# Patient Record
Sex: Female | Born: 1935 | Race: White | Hispanic: No | Smoking: Never smoker
Health system: Southern US, Community
[De-identification: ages and names within clinical notes are randomized; demographics above are authoritative.]

## PROBLEM LIST (undated history)

## (undated) DIAGNOSIS — E079 Disorder of thyroid, unspecified: Secondary | ICD-10-CM

## (undated) DIAGNOSIS — C801 Malignant (primary) neoplasm, unspecified: Secondary | ICD-10-CM

## (undated) DIAGNOSIS — N289 Disorder of kidney and ureter, unspecified: Secondary | ICD-10-CM

## (undated) DIAGNOSIS — I10 Essential (primary) hypertension: Secondary | ICD-10-CM

## (undated) HISTORY — PX: MASTECTOMY: SHX3

---

## 1998-01-17 ENCOUNTER — Other Ambulatory Visit: Admission: RE | Admit: 1998-01-17 | Discharge: 1998-01-17 | Payer: Self-pay | Admitting: Oncology

## 1998-07-08 ENCOUNTER — Ambulatory Visit (HOSPITAL_BASED_OUTPATIENT_CLINIC_OR_DEPARTMENT_OTHER): Admission: RE | Admit: 1998-07-08 | Discharge: 1998-07-08 | Payer: Self-pay | Admitting: Otolaryngology

## 1998-10-03 ENCOUNTER — Other Ambulatory Visit: Admission: RE | Admit: 1998-10-03 | Discharge: 1998-10-03 | Payer: Self-pay | Admitting: Oncology

## 1999-06-13 ENCOUNTER — Encounter: Admission: RE | Admit: 1999-06-13 | Discharge: 1999-06-13 | Payer: Self-pay | Admitting: Oncology

## 1999-06-15 ENCOUNTER — Inpatient Hospital Stay (HOSPITAL_COMMUNITY): Admission: EM | Admit: 1999-06-15 | Discharge: 1999-06-16 | Payer: Self-pay | Admitting: Emergency Medicine

## 1999-06-15 ENCOUNTER — Encounter: Payer: Self-pay | Admitting: Emergency Medicine

## 2000-06-13 ENCOUNTER — Other Ambulatory Visit: Admission: RE | Admit: 2000-06-13 | Discharge: 2000-06-13 | Payer: Self-pay | Admitting: Internal Medicine

## 2000-07-01 ENCOUNTER — Encounter: Admission: RE | Admit: 2000-07-01 | Discharge: 2000-07-01 | Payer: Self-pay | Admitting: Oncology

## 2000-07-01 ENCOUNTER — Encounter: Payer: Self-pay | Admitting: Oncology

## 2001-06-18 ENCOUNTER — Encounter: Payer: Self-pay | Admitting: Oncology

## 2001-06-18 ENCOUNTER — Encounter: Admission: RE | Admit: 2001-06-18 | Discharge: 2001-06-18 | Payer: Self-pay | Admitting: Oncology

## 2001-11-07 ENCOUNTER — Ambulatory Visit (HOSPITAL_COMMUNITY): Admission: RE | Admit: 2001-11-07 | Discharge: 2001-11-07 | Payer: Self-pay | Admitting: Internal Medicine

## 2001-11-07 ENCOUNTER — Encounter: Payer: Self-pay | Admitting: Internal Medicine

## 2002-05-26 ENCOUNTER — Encounter: Admission: RE | Admit: 2002-05-26 | Discharge: 2002-05-26 | Payer: Self-pay | Admitting: Internal Medicine

## 2002-05-26 ENCOUNTER — Encounter: Payer: Self-pay | Admitting: Internal Medicine

## 2003-01-06 ENCOUNTER — Other Ambulatory Visit: Admission: RE | Admit: 2003-01-06 | Discharge: 2003-01-06 | Payer: Self-pay | Admitting: Internal Medicine

## 2003-01-27 ENCOUNTER — Encounter: Payer: Self-pay | Admitting: Internal Medicine

## 2003-01-27 ENCOUNTER — Encounter: Admission: RE | Admit: 2003-01-27 | Discharge: 2003-01-27 | Payer: Self-pay | Admitting: Internal Medicine

## 2003-04-16 ENCOUNTER — Encounter: Admission: RE | Admit: 2003-04-16 | Discharge: 2003-04-16 | Payer: Self-pay | Admitting: Oncology

## 2003-04-16 ENCOUNTER — Encounter: Payer: Self-pay | Admitting: Oncology

## 2003-10-05 ENCOUNTER — Encounter: Admission: RE | Admit: 2003-10-05 | Discharge: 2003-10-05 | Payer: Self-pay | Admitting: Internal Medicine

## 2003-10-13 ENCOUNTER — Encounter: Admission: RE | Admit: 2003-10-13 | Discharge: 2003-10-13 | Payer: Self-pay | Admitting: Orthopaedic Surgery

## 2004-02-04 ENCOUNTER — Encounter: Admission: RE | Admit: 2004-02-04 | Discharge: 2004-02-04 | Payer: Self-pay | Admitting: Nephrology

## 2004-04-21 ENCOUNTER — Encounter: Admission: RE | Admit: 2004-04-21 | Discharge: 2004-04-21 | Payer: Self-pay | Admitting: Oncology

## 2005-05-24 ENCOUNTER — Encounter: Admission: RE | Admit: 2005-05-24 | Discharge: 2005-05-24 | Payer: Self-pay | Admitting: Oncology

## 2006-05-27 ENCOUNTER — Encounter: Admission: RE | Admit: 2006-05-27 | Discharge: 2006-05-27 | Payer: Self-pay | Admitting: Internal Medicine

## 2007-07-17 ENCOUNTER — Encounter: Admission: RE | Admit: 2007-07-17 | Discharge: 2007-07-17 | Payer: Self-pay | Admitting: Internal Medicine

## 2007-11-11 ENCOUNTER — Ambulatory Visit: Payer: Self-pay | Admitting: Sports Medicine

## 2007-11-11 DIAGNOSIS — M719 Bursopathy, unspecified: Secondary | ICD-10-CM

## 2007-11-11 DIAGNOSIS — M67919 Unspecified disorder of synovium and tendon, unspecified shoulder: Secondary | ICD-10-CM | POA: Insufficient documentation

## 2010-09-24 ENCOUNTER — Encounter: Payer: Self-pay | Admitting: *Deleted

## 2012-10-11 ENCOUNTER — Encounter (HOSPITAL_COMMUNITY): Payer: Self-pay | Admitting: Emergency Medicine

## 2012-10-11 ENCOUNTER — Emergency Department (HOSPITAL_COMMUNITY): Payer: Medicare Other

## 2012-10-11 ENCOUNTER — Emergency Department (HOSPITAL_COMMUNITY)
Admission: EM | Admit: 2012-10-11 | Discharge: 2012-10-11 | Disposition: A | Discharge status: Home / Self Care | Payer: Medicare Other | Attending: Emergency Medicine | Admitting: Emergency Medicine

## 2012-10-11 DIAGNOSIS — Z853 Personal history of malignant neoplasm of breast: Secondary | ICD-10-CM | POA: Insufficient documentation

## 2012-10-11 DIAGNOSIS — S51822A Laceration with foreign body of left forearm, initial encounter: Secondary | ICD-10-CM

## 2012-10-11 DIAGNOSIS — E079 Disorder of thyroid, unspecified: Secondary | ICD-10-CM | POA: Insufficient documentation

## 2012-10-11 DIAGNOSIS — W010XXA Fall on same level from slipping, tripping and stumbling without subsequent striking against object, initial encounter: Secondary | ICD-10-CM | POA: Insufficient documentation

## 2012-10-11 DIAGNOSIS — I10 Essential (primary) hypertension: Secondary | ICD-10-CM | POA: Insufficient documentation

## 2012-10-11 DIAGNOSIS — Y9301 Activity, walking, marching and hiking: Secondary | ICD-10-CM | POA: Insufficient documentation

## 2012-10-11 DIAGNOSIS — W1809XA Striking against other object with subsequent fall, initial encounter: Secondary | ICD-10-CM | POA: Insufficient documentation

## 2012-10-11 DIAGNOSIS — Z23 Encounter for immunization: Secondary | ICD-10-CM | POA: Insufficient documentation

## 2012-10-11 DIAGNOSIS — S51809A Unspecified open wound of unspecified forearm, initial encounter: Secondary | ICD-10-CM | POA: Insufficient documentation

## 2012-10-11 DIAGNOSIS — Y9210 Unspecified residential institution as the place of occurrence of the external cause: Secondary | ICD-10-CM | POA: Insufficient documentation

## 2012-10-11 DIAGNOSIS — Z7982 Long term (current) use of aspirin: Secondary | ICD-10-CM | POA: Insufficient documentation

## 2012-10-11 DIAGNOSIS — Z79899 Other long term (current) drug therapy: Secondary | ICD-10-CM | POA: Insufficient documentation

## 2012-10-11 HISTORY — DX: Malignant (primary) neoplasm, unspecified: C80.1

## 2012-10-11 HISTORY — DX: Essential (primary) hypertension: I10

## 2012-10-11 HISTORY — DX: Disorder of thyroid, unspecified: E07.9

## 2012-10-11 MED ORDER — CEPHALEXIN 500 MG PO CAPS
500.0000 mg | ORAL_CAPSULE | Freq: Once | ORAL | Status: AC
  Administered 2012-10-11: 500 mg via ORAL
  Filled 2012-10-11: qty 1

## 2012-10-11 MED ORDER — CEPHALEXIN 500 MG PO CAPS: 500.0000 mg | ORAL_CAPSULE | Freq: Four times a day (QID) | ORAL | Status: DC

## 2012-10-11 MED ORDER — TETANUS-DIPHTH-ACELL PERTUSSIS 5-2.5-18.5 LF-MCG/0.5 IM SUSP
0.5000 mL | Freq: Once | INTRAMUSCULAR | Status: AC
  Administered 2012-10-11: 0.5 mL via INTRAMUSCULAR
  Filled 2012-10-11: qty 0.5

## 2012-10-11 NOTE — ED Provider Notes (Signed)
History     CSN: [id number]  Arrival date & time 10/11/12  1956   First MD Initiated Contact with Patient 10/11/12 2124      Chief Complaint  Patient presents with  . Fall    (Consider location/radiation/quality/duration/timing/severity/associated sxs/prior treatment) HPI  Past Medical History  Diagnosis Date  . Cancer     breast  . Hypertension   . Thyroid disease     Past Surgical History  Procedure Laterality Date  . Mastectomy      right    No family history on file.  History  Substance Use Topics  . Smoking status: Never Smoker   . Smokeless tobacco: Not on file  . Alcohol Use: No    OB History   Grav Para Term Preterm Abortions TAB SAB Ect Mult Living                  Review of Systems  Allergies  Review of patient's allergies indicates no known allergies.  Home Medications   Current Outpatient Rx  Name  Route  Sig  Dispense  Refill  . AmLODIPine Besylate (NORVASC PO)   Oral   Take 1 tablet by mouth every morning.         Marland Kitchen aspirin EC 81 MG tablet   Oral   Take 81 mg by mouth every evening.         Marland Kitchen b complex vitamins capsule   Oral   Take 1 capsule by mouth every evening.         Marland Kitchen CALCITRIOL PO   Oral   Take 1 capsule by mouth every morning.         Marland Kitchen co-enzyme Q-10 30 MG capsule   Oral   Take 30 mg by mouth daily with lunch.         . fish oil-omega-3 fatty acids 1000 MG capsule   Oral   Take 1 g by mouth 2 (two) times daily.         . Multiple Minerals-Vitamins (CALCIUM CITRATE PLUS/MAGNESIUM) TABS   Oral   Take 1 tablet by mouth daily with lunch.         . Multiple Vitamins-Minerals (ICAPS PO)   Oral   Take 1 capsule by mouth 2 (two) times daily.         . Telmisartan (MICARDIS PO)   Oral   Take 1 tablet by mouth every evening.         . vitamin B-12 (CYANOCOBALAMIN) 100 MCG tablet   Oral   Take 100 mcg by mouth every evening.           BP 162/66  Pulse 81  Temp(Src) 98.2 F (36.8 C)  (Oral)  Resp 16  SpO2 99%  Physical Exam  ED Course  LACERATION REPAIR Date/Time: 10/11/2012 10:13 PM Performed by: Arman Filter Authorized by: Arman Filter Consent: Verbal consent obtained. Risks and benefits: risks, benefits and alternatives were discussed Consent given by: patient Patient understanding: patient states understanding of the procedure being performed Patient identity confirmed: verbally with patient Time out: Immediately prior to procedure a "time out" was called to verify the correct patient, procedure, equipment, support staff and site/side marked as required. Body area: upper extremity Location details: left lower arm Laceration length: 7 cm Foreign bodies: no foreign bodies Tendon involvement: none Nerve involvement: none Vascular damage: no Anesthesia: local infiltration Local anesthetic: lidocaine 1% with epinephrine Anesthetic total: 4 ml Patient sedated: no Preparation: Patient was  prepped and draped in the usual sterile fashion. Irrigation solution: saline Irrigation method: syringe Amount of cleaning: extensive Debridement: minimal Degree of undermining: none Skin closure: 3-0 Prolene Number of sutures: 12 Technique: simple Approximation: close Approximation difficulty: simple Dressing: antibiotic ointment, non-adhesive packing strip and gauze packing Patient tolerance: Patient tolerated the procedure well with no immediate complications.   (including critical care time)  Labs Reviewed - No data to display Dg Forearm Left  10/11/2012  *RADIOLOGY REPORT*  Clinical Data: Laceration and bruising to the mid shaft posterior lateral forearm after fall.  LEFT FOREARM - 2 VIEW  Comparison: None.  Findings: Soft tissue swelling over the dorsal aspect of the forearm with infiltration in the subcutaneous fat suggesting contusion or hematoma.  No radiopaque soft tissue foreign body or gas collection demonstrated.  The bones appear intact.  No evidence of  acute fracture or subluxation.  Tiny fragment adjacent to the ulnar styloid process is nonspecific and may represent old ununited ossicle or avulsion fracture.  Correlation with location of pain is recommended.  IMPRESSION: Soft tissue contusion/hematoma over the dorsal aspect of the forearm.  No displaced fractures identified.  Nonspecific ossicle over the ulnar styloid process.   Original Report Authenticated By: Burman Nieves, M.D.      No diagnosis found.    MDM           Arman Filter, NP 10/11/12 2216

## 2012-10-11 NOTE — ED Provider Notes (Signed)
Medical screening examination/treatment/procedure(s) were performed by non-physician practitioner and as supervising physician I was immediately available for consultation/collaboration.  See my separate note for H&P.   Richardean Canal, MD 10/11/12 2217

## 2012-10-11 NOTE — ED Notes (Signed)
MD at bedside. 

## 2012-10-11 NOTE — ED Provider Notes (Signed)
History     CSN: [id number]  Arrival date & time 10/11/12  1956   First MD Initiated Contact with Patient 10/11/12 2124      Chief Complaint  Patient presents with  . Fall    (Consider location/radiation/quality/duration/timing/severity/associated sxs/prior treatment) The history is provided by the patient and a relative.  Megan Gallegos is a 79 y.o. female hx of HTN, breast Ca here with L forearm laceration. She lives at independent living and was walking and had a mechanical fall and hit left forearm on a cabinet. No head injury or LOC. Sent for laceration repair. Tetanus unknown.    Past Medical History  Diagnosis Date  . Cancer     breast  . Hypertension   . Thyroid disease     Past Surgical History  Procedure Laterality Date  . Mastectomy      right    No family history on file.  History  Substance Use Topics  . Smoking status: Never Smoker   . Smokeless tobacco: Not on file  . Alcohol Use: No    OB History   Grav Para Term Preterm Abortions TAB SAB Ect Mult Living                  Review of Systems  Musculoskeletal:       L arm pain  Skin: Positive for wound.  All other systems reviewed and are negative.    Allergies  Review of patient's allergies indicates no known allergies.  Home Medications   Current Outpatient Rx  Name  Route  Sig  Dispense  Refill  . AmLODIPine Besylate (NORVASC PO)   Oral   Take 1 tablet by mouth every morning.         Marland Kitchen aspirin EC 81 MG tablet   Oral   Take 81 mg by mouth every evening.         Marland Kitchen b complex vitamins capsule   Oral   Take 1 capsule by mouth every evening.         Marland Kitchen CALCITRIOL PO   Oral   Take 1 capsule by mouth every morning.         Marland Kitchen co-enzyme Q-10 30 MG capsule   Oral   Take 30 mg by mouth daily with lunch.         . fish oil-omega-3 fatty acids 1000 MG capsule   Oral   Take 1 g by mouth 2 (two) times daily.         . Multiple Minerals-Vitamins (CALCIUM CITRATE  PLUS/MAGNESIUM) TABS   Oral   Take 1 tablet by mouth daily with lunch.         . Multiple Vitamins-Minerals (ICAPS PO)   Oral   Take 1 capsule by mouth 2 (two) times daily.         . Telmisartan (MICARDIS PO)   Oral   Take 1 tablet by mouth every evening.         . vitamin B-12 (CYANOCOBALAMIN) 100 MCG tablet   Oral   Take 100 mcg by mouth every evening.           BP 162/66  Pulse 81  Temp(Src) 98.2 F (36.8 C) (Oral)  Resp 16  SpO2 99%  Physical Exam  Nursing note and vitals reviewed. Constitutional: She is oriented to person, place, and time. She appears well-developed and well-nourished.  HENT:  Head: Normocephalic.  Mouth/Throat: Oropharynx is clear and moist.  Eyes: Conjunctivae are normal.  Pupils are equal, round, and reactive to light.  Neck: Normal range of motion. Neck supple.  Cardiovascular: Normal rate.   Pulmonary/Chest: Effort normal.  Abdominal: Soft.  Musculoskeletal: Normal range of motion.  L forearm with obvious bruise and V shaped laceration. + erythema surrounding the wound as well. No subcutaneous air.   Neurological: She is alert and oriented to person, place, and time.  Skin: Skin is warm.     Psychiatric: She has a normal mood and affect. Her behavior is normal. Judgment and thought content normal.    ED Course  Procedures (including critical care time)  Labs Reviewed - No data to display Dg Forearm Left  10/11/2012  *RADIOLOGY REPORT*  Clinical Data: Laceration and bruising to the mid shaft posterior lateral forearm after fall.  LEFT FOREARM - 2 VIEW  Comparison: None.  Findings: Soft tissue swelling over the dorsal aspect of the forearm with infiltration in the subcutaneous fat suggesting contusion or hematoma.  No radiopaque soft tissue foreign body or gas collection demonstrated.  The bones appear intact.  No evidence of acute fracture or subluxation.  Tiny fragment adjacent to the ulnar styloid process is nonspecific and may  represent old ununited ossicle or avulsion fracture.  Correlation with location of pain is recommended.  IMPRESSION: Soft tissue contusion/hematoma over the dorsal aspect of the forearm.  No displaced fractures identified.  Nonspecific ossicle over the ulnar styloid process.   Original Report Authenticated By: Burman Nieves, M.D.      No diagnosis found.    MDM  Megan Gallegos is a 79 y.o. female here with L forearm laceration. Tetanus updated. Laceration repaired by PA. Also may have small hematoma vs cellulitis. Will give keflex for empiric treatment. Return precautions given to patient and family.          Richardean Canal, MD 10/11/12 2145

## 2012-10-11 NOTE — ED Notes (Signed)
Pt arrived POV with daughter from [facility], pt states she tripped over chairs @ 1530, large skin tear noted to L forearm. Bleeding controlled. Swelling noted

## 2013-11-11 ENCOUNTER — Encounter (HOSPITAL_COMMUNITY): Payer: Self-pay | Admitting: Emergency Medicine

## 2013-11-11 ENCOUNTER — Emergency Department (HOSPITAL_COMMUNITY)
Admission: EM | Admit: 2013-11-11 | Discharge: 2013-11-11 | Disposition: A | Discharge status: Home / Self Care | Payer: Medicare Other | Attending: Emergency Medicine | Admitting: Emergency Medicine

## 2013-11-11 DIAGNOSIS — Z7982 Long term (current) use of aspirin: Secondary | ICD-10-CM | POA: Insufficient documentation

## 2013-11-11 DIAGNOSIS — Z8639 Personal history of other endocrine, nutritional and metabolic disease: Secondary | ICD-10-CM | POA: Insufficient documentation

## 2013-11-11 DIAGNOSIS — Z853 Personal history of malignant neoplasm of breast: Secondary | ICD-10-CM | POA: Insufficient documentation

## 2013-11-11 DIAGNOSIS — R040 Epistaxis: Secondary | ICD-10-CM | POA: Insufficient documentation

## 2013-11-11 DIAGNOSIS — Z87448 Personal history of other diseases of urinary system: Secondary | ICD-10-CM | POA: Insufficient documentation

## 2013-11-11 DIAGNOSIS — Z862 Personal history of diseases of the blood and blood-forming organs and certain disorders involving the immune mechanism: Secondary | ICD-10-CM | POA: Insufficient documentation

## 2013-11-11 DIAGNOSIS — I10 Essential (primary) hypertension: Secondary | ICD-10-CM | POA: Insufficient documentation

## 2013-11-11 DIAGNOSIS — Z79899 Other long term (current) drug therapy: Secondary | ICD-10-CM | POA: Insufficient documentation

## 2013-11-11 HISTORY — DX: Disorder of kidney and ureter, unspecified: N28.9

## 2013-11-11 MED ORDER — IRBESARTAN 300 MG PO TABS
300.0000 mg | ORAL_TABLET | Freq: Every day | ORAL | Status: DC
  Administered 2013-11-11: 300 mg via ORAL
  Filled 2013-11-11 (×2): qty 1

## 2013-11-11 NOTE — ED Notes (Signed)
Pt has had nosebleed since 7:30pm that has been intermittent. Accompanied by daughter. Hypertensive. Has been out of micardis for 1-2 days. Denies any other sx. Alert and oriented.

## 2013-11-11 NOTE — ED Provider Notes (Signed)
CSN: [id number]     Arrival date & time 11/11/13  0003 History   First MD Initiated Contact with Patient 11/11/13 0017     Chief Complaint  Patient presents with  . Epistaxis     (Consider location/radiation/quality/duration/timing/severity/associated sxs/prior Treatment) HPI History provided by patient. Nosebleed onset tonight. No trauma. Mild and intermittent. Takes baby aspirin daily, no other blood thinners. Patient did run out of her blood pressure medication one to 2 days ago, presents with high blood pressure. Daughter bedside. No recent cough, congestion or runny nose. No known inciting factors. No history of the same.  Past Medical History  Diagnosis Date  . Cancer     breast  . Hypertension   . Thyroid disease   . Kidney disease    Past Surgical History  Procedure Laterality Date  . Mastectomy      right   History reviewed. No pertinent family history. History  Substance Use Topics  . Smoking status: Never Smoker   . Smokeless tobacco: Not on file  . Alcohol Use: No   OB History   Grav Para Term Preterm Abortions TAB SAB Ect Mult Living                 Review of Systems  Constitutional: Negative for fever and chills.  HENT: Negative for facial swelling, postnasal drip, sore throat and trouble swallowing.   Eyes: Negative for itching.  Respiratory: Negative for cough and shortness of breath.   Cardiovascular: Negative for chest pain.  Gastrointestinal: Negative for vomiting and abdominal pain.  Genitourinary: Negative for hematuria.  Musculoskeletal: Negative for back pain.  Skin: Negative for rash.  Neurological: Negative for headaches.  All other systems reviewed and are negative.      Allergies  Review of patient's allergies indicates no known allergies.  Home Medications   Current Outpatient Rx  Name  Route  Sig  Dispense  Refill  . amLODipine (NORVASC) 10 MG tablet   Oral   Take 10 mg by mouth daily.         Marland Kitchen aspirin EC 81 MG tablet   Oral   Take 81 mg by mouth every evening.         Marland Kitchen b complex vitamins capsule   Oral   Take 1 capsule by mouth every evening.         . Calcium Carbonate (CALTRATE 600 PO)   Oral   Take 1 tablet by mouth daily.         Marland Kitchen co-enzyme Q-10 30 MG capsule   Oral   Take 30 mg by mouth daily with lunch.         . doxercalciferol (HECTOROL) 0.5 MCG capsule   Oral   Take 0.5 mcg by mouth daily.         . fish oil-omega-3 fatty acids 1000 MG capsule   Oral   Take 1 g by mouth 2 (two) times daily.         . Multiple Vitamins-Minerals (OCUVITE PRESERVISION PO)   Oral   Take 1 tablet by mouth daily.         Marland Kitchen telmisartan (MICARDIS) 80 MG tablet   Oral   Take 80 mg by mouth daily.          BP 187/71  Pulse 68  Temp(Src) 98.3 F (36.8 C) (Oral)  SpO2 99% Physical Exam  Constitutional: She is oriented to person, place, and time. She appears well-developed and well-nourished.  HENT:  Head:  Normocephalic and atraumatic.  Eyes: EOM are normal. Pupils are equal, round, and reactive to light.  Dry epistaxis right nare more so than left - no visualized bleed site. No PND. No nasal deformity   Neck: Normal range of motion. Neck supple.  Cardiovascular: Normal rate, regular rhythm and intact distal pulses.   Pulmonary/Chest: Effort normal and breath sounds normal. No respiratory distress.  Abdominal: Soft. There is no tenderness.  Musculoskeletal: Normal range of motion. She exhibits no edema.  Lymphadenopathy:    She has no cervical adenopathy.  Neurological: She is alert and oriented to person, place, and time.  Skin: Skin is warm and dry.    ED Course  Procedures (including critical care time) Labs Review Labs Reviewed - No data to display Imaging Review No results found.  Nose blown. Neo-Synephrine to cotton ball, placed in each nare.  On recheck packing removed, no active bleeding.  PT given her home dose of BP medication and observed, no return of  bleeding. BP normalizing.     Plan d/c home, has already held baby ASA today. ENT referral as needed for any persistent symptoms. ER return precautions provided. PT stable and appropriate for discharge at this time.    MDM   Final diagnoses:  Epistaxis  Hypertension     No active bleeding in the ER.  BP improving. Serial evaluations. Interventions as above.   VS and nurses notes reviewed and considered.   Teressa Lower, MD 11/11/13 [phone]

## 2013-11-11 NOTE — Discharge Instructions (Signed)

## 2013-11-11 NOTE — ED Notes (Signed)
Pt up to bathroom.

## 2013-11-11 NOTE — ED Notes (Signed)
Pt states she shouldn't  had those potato chips for a snack

## 2016-01-05 DIAGNOSIS — M25551 Pain in right hip: Secondary | ICD-10-CM | POA: Diagnosis not present

## 2016-02-28 DIAGNOSIS — I129 Hypertensive chronic kidney disease with stage 1 through stage 4 chronic kidney disease, or unspecified chronic kidney disease: Secondary | ICD-10-CM | POA: Diagnosis not present

## 2016-02-28 DIAGNOSIS — E538 Deficiency of other specified B group vitamins: Secondary | ICD-10-CM | POA: Diagnosis not present

## 2016-02-28 DIAGNOSIS — R413 Other amnesia: Secondary | ICD-10-CM | POA: Diagnosis not present

## 2016-02-28 DIAGNOSIS — Z853 Personal history of malignant neoplasm of breast: Secondary | ICD-10-CM | POA: Diagnosis not present

## 2016-02-28 DIAGNOSIS — N189 Chronic kidney disease, unspecified: Secondary | ICD-10-CM | POA: Diagnosis not present

## 2016-02-28 DIAGNOSIS — N2581 Secondary hyperparathyroidism of renal origin: Secondary | ICD-10-CM | POA: Diagnosis not present

## 2016-02-28 DIAGNOSIS — D631 Anemia in chronic kidney disease: Secondary | ICD-10-CM | POA: Diagnosis not present

## 2016-02-28 DIAGNOSIS — Z8639 Personal history of other endocrine, nutritional and metabolic disease: Secondary | ICD-10-CM | POA: Diagnosis not present

## 2016-02-28 DIAGNOSIS — N183 Chronic kidney disease, stage 3 (moderate): Secondary | ICD-10-CM | POA: Diagnosis not present

## 2016-05-07 ENCOUNTER — Emergency Department (HOSPITAL_COMMUNITY)
Admission: EM | Admit: 2016-05-07 | Discharge: 2016-05-07 | Disposition: A | Discharge status: Home / Self Care | Payer: Medicare Other | Attending: Emergency Medicine | Admitting: Emergency Medicine

## 2016-05-07 ENCOUNTER — Emergency Department (HOSPITAL_COMMUNITY): Payer: Medicare Other

## 2016-05-07 ENCOUNTER — Encounter (HOSPITAL_COMMUNITY): Payer: Self-pay | Admitting: Nurse Practitioner

## 2016-05-07 DIAGNOSIS — G309 Alzheimer's disease, unspecified: Secondary | ICD-10-CM | POA: Diagnosis not present

## 2016-05-07 DIAGNOSIS — F0280 Dementia in other diseases classified elsewhere without behavioral disturbance: Secondary | ICD-10-CM | POA: Diagnosis not present

## 2016-05-07 DIAGNOSIS — R0789 Other chest pain: Secondary | ICD-10-CM | POA: Insufficient documentation

## 2016-05-07 DIAGNOSIS — Z853 Personal history of malignant neoplasm of breast: Secondary | ICD-10-CM | POA: Diagnosis not present

## 2016-05-07 DIAGNOSIS — I10 Essential (primary) hypertension: Secondary | ICD-10-CM | POA: Insufficient documentation

## 2016-05-07 DIAGNOSIS — G308 Other Alzheimer's disease: Secondary | ICD-10-CM | POA: Insufficient documentation

## 2016-05-07 DIAGNOSIS — Z7982 Long term (current) use of aspirin: Secondary | ICD-10-CM | POA: Diagnosis not present

## 2016-05-07 DIAGNOSIS — R079 Chest pain, unspecified: Secondary | ICD-10-CM | POA: Diagnosis not present

## 2016-05-07 LAB — CBC WITH DIFFERENTIAL/PLATELET
Basophils Absolute: 0 10*3/uL (ref 0.0–0.1)
Basophils Relative: 0 %
EOS ABS: 0.3 10*3/uL (ref 0.0–0.7)
EOS PCT: 5 %
HCT: 34.7 % — ABNORMAL LOW (ref 36.0–46.0)
Hemoglobin: 11.5 g/dL — ABNORMAL LOW (ref 12.0–15.0)
LYMPHS ABS: 1.6 10*3/uL (ref 0.7–4.0)
LYMPHS PCT: 27 %
MCH: 29.4 pg (ref 26.0–34.0)
MCHC: 33.1 g/dL (ref 30.0–36.0)
MCV: 88.7 fL (ref 78.0–100.0)
MONO ABS: 0.6 10*3/uL (ref 0.1–1.0)
MONOS PCT: 10 %
Neutro Abs: 3.5 10*3/uL (ref 1.7–7.7)
Neutrophils Relative %: 58 %
PLATELETS: 379 10*3/uL (ref 150–400)
RBC: 3.91 MIL/uL (ref 3.87–5.11)
RDW: 13.5 % (ref 11.5–15.5)
WBC: 6 10*3/uL (ref 4.0–10.5)

## 2016-05-07 LAB — BASIC METABOLIC PANEL
Anion gap: 9 (ref 5–15)
BUN: 27 mg/dL — AB (ref 6–20)
CHLORIDE: 99 mmol/L — AB (ref 101–111)
CO2: 25 mmol/L (ref 22–32)
CREATININE: 1.57 mg/dL — AB (ref 0.44–1.00)
Calcium: 9.1 mg/dL (ref 8.9–10.3)
GFR calc Af Amer: 31 mL/min — ABNORMAL LOW (ref >60)
GFR calc non Af Amer: 27 mL/min — ABNORMAL LOW (ref >60)
GLUCOSE: 101 mg/dL — AB (ref 65–99)
POTASSIUM: 4.7 mmol/L (ref 3.5–5.1)
SODIUM: 133 mmol/L — AB (ref 135–145)

## 2016-05-07 LAB — I-STAT TROPONIN, ED: TROPONIN I, POC: 0.01 ng/mL (ref 0.00–0.08)

## 2016-05-07 NOTE — ED Provider Notes (Signed)
Megan Gallegos DEPT Provider Note   CSN: [id number] Arrival date & time: 05/07/16  1129     History   Chief Complaint Chief Complaint  Patient presents with  . Chest Pain    HPI Megan Gallegos is a 82 y.o. female.  The history is provided by the EMS personnel and a relative. No language interpreter was used.  Chest Pain      Megan Gallegos is a 82 y.o. female who presents to the Emergency Department complaining of chest pain.   Level V caveat due to dementia. History is provided by EMS. Patient has a history of advanced Alzheimer's, resides in independent living facility and this morning she was complaining of chest pain and intermittently grabbing her left chest. This was witnessed for EMS and fire. Patient now denies any pain. No known recent illnesses.  Past Medical History:  Diagnosis Date  . Cancer (HCC)    breast  . Hypertension   . Kidney disease   . Thyroid disease     Patient Active Problem List   Diagnosis Date Noted  . BURSITIS, RIGHT SHOULDER 11/11/2007    Past Surgical History:  Procedure Laterality Date  . MASTECTOMY     right    OB History    No data available       Home Medications    Prior to Admission medications   Medication Sig Start Date End Date Taking? Authorizing Provider  amLODipine (NORVASC) 10 MG tablet Take 10 mg by mouth daily.    Historical Provider, MD  aspirin EC 81 MG tablet Take 81 mg by mouth every evening.    Historical Provider, MD  b complex vitamins capsule Take 1 capsule by mouth every evening.    Historical Provider, MD  Calcium Carbonate (CALTRATE 600 PO) Take 1 tablet by mouth daily.    Historical Provider, MD  co-enzyme Q-10 30 MG capsule Take 30 mg by mouth daily with lunch.    Historical Provider, MD  doxercalciferol (HECTOROL) 0.5 MCG capsule Take 0.5 mcg by mouth daily.    Historical Provider, MD  fish oil-omega-3 fatty acids 1000 MG capsule Take 1 g by mouth 2 (two) times daily.    Historical Provider, MD    Multiple Vitamins-Minerals (OCUVITE PRESERVISION PO) Take 1 tablet by mouth daily.    Historical Provider, MD  telmisartan (MICARDIS) 80 MG tablet Take 80 mg by mouth daily.    Historical Provider, MD    Family History No family history on file.  Social History Social History  Substance Use Topics  . Smoking status: Never Smoker  . Smokeless tobacco: Not on file  . Alcohol use No     Allergies   Review of patient's allergies indicates no known allergies.   Review of Systems Review of Systems  Unable to perform ROS: Dementia  Cardiovascular: Positive for chest pain.     Physical Exam Updated Vital Signs BP 163/63 (BP Location: Left Arm)   Pulse 77   Temp 97.3 F (36.3 C) (Oral)   Resp 18   SpO2 97%   Physical Exam  Constitutional: She appears well-developed and well-nourished.  HENT:  Head: Normocephalic and atraumatic.  Cardiovascular: Normal rate and regular rhythm.   No murmur heard. Pulmonary/Chest: Effort normal. No respiratory distress.  Occasional fine crackles in bilateral bases  Abdominal: Soft. There is no tenderness. There is no rebound and no guarding.  Musculoskeletal: She exhibits no edema or tenderness.  Neurological: She is alert.  Very confused, disoriented  to place and time.  Skin: Skin is warm and dry.  Psychiatric:  Mildly agitated, shouting at times.  Nursing note and vitals reviewed.    ED Treatments / Results  Labs (all labs ordered are listed, but only abnormal results are displayed) Labs Reviewed  BASIC METABOLIC PANEL - Abnormal; Notable for the following:       Result Value   Sodium 133 (*)    Chloride 99 (*)    Glucose, Bld 101 (*)    BUN 27 (*)    Creatinine, Ser 1.57 (*)    GFR calc non Af Amer 27 (*)    GFR calc Af Amer 31 (*)    All other components within normal limits  CBC WITH DIFFERENTIAL/PLATELET - Abnormal; Notable for the following:    Hemoglobin 11.5 (*)    HCT 34.7 (*)    All other components within  normal limits  I-STAT TROPOININ, ED    EKG  EKG Interpretation  Date/Time:  Monday May 07 2016 11:36:40 EDT Ventricular Rate:  68 PR Interval:    QRS Duration: 80 QT Interval:  405 QTC Calculation: 431 R Axis:   -34 Text Interpretation:  Sinus rhythm Left axis deviation Low voltage, extremity leads Confirmed by Hazle Coca [phone]) on 05/07/2016 12:03:15 PM       Radiology Dg Chest Port 1 View  Result Date: 05/07/2016 CLINICAL DATA:  Chest pain, confusion, history cancer, hypertension EXAM: PORTABLE CHEST 1 VIEW COMPARISON:  Portable exam 1204 hours without priors for comparison. FINDINGS: Borderline enlargement of cardiac silhouette. Mediastinal contours and pulmonary vascularity normal. Atherosclerotic calcification mild tortuosity of thoracic aorta. Minimal LEFT basilar atelectasis. Bronchitic changes without infiltrate, pleural effusion or pneumothorax. Bones demineralized. IMPRESSION: Bronchitic changes with minimal LEFT basilar atelectasis. Aortic atherosclerosis. Electronically Signed   By: Lavonia Dana M.D.   On: 05/07/2016 12:20    Procedures Procedures (including critical care time)  Medications Ordered in ED Medications - No data to display   Initial Impression / Assessment and Plan / ED Course  I have reviewed the triage vital signs and the nursing notes.  Pertinent labs & imaging results that were available during my care of the patient were reviewed by me and considered in my medical decision making (see chart for details).  Clinical Course    Pt with hx/o advanced alzheimers here with apparent episode of chest pain, now resolved.  She is asymptomatic in the ED and wishes to return home.  No evidence of ACS, pna, dissection.  Presentation not c/w PE.  BMP with renal insuffiency - pt with hx/o same but no prior labs available for comparison.  Plan to d/c home with close outpatient follow up with PCP, nephrology. Home care and return precautions discussed with  family.    Final Clinical Impressions(s) / ED Diagnoses   Final diagnoses:  Atypical chest pain    New Prescriptions Discharge Medication List as of 05/07/2016 12:59 PM       Quintella Reichert, MD 05/07/16 [phone]

## 2016-05-07 NOTE — ED Notes (Signed)
Portable xray at bedside.

## 2016-05-07 NOTE — ED Triage Notes (Signed)
Per EMS pt from [facility]. Pt was c/o chest pain, and patient would jerk and hold left chest intermittently. Patient demented- repeatedly asking "where is diane?". Diane is daughter, who is out of town, but granddaughter is on the way.   Pt denies chest pain presently and there is no tenderness. Patient does not recall event or any associated symptoms. Patient in NAD. Pt. Ambulatory with steady gait.

## 2016-05-07 NOTE — Discharge Instructions (Addendum)
Megan Gallegos Creatinine was 1.5 today.  Contact her kidney doctor tomorrow to see if this is normal for her.

## 2016-06-18 DIAGNOSIS — L814 Other melanin hyperpigmentation: Secondary | ICD-10-CM | POA: Diagnosis not present

## 2016-06-18 DIAGNOSIS — D1801 Hemangioma of skin and subcutaneous tissue: Secondary | ICD-10-CM | POA: Diagnosis not present

## 2016-06-18 DIAGNOSIS — D692 Other nonthrombocytopenic purpura: Secondary | ICD-10-CM | POA: Diagnosis not present

## 2016-06-18 DIAGNOSIS — L821 Other seborrheic keratosis: Secondary | ICD-10-CM | POA: Diagnosis not present

## 2016-06-18 DIAGNOSIS — Z85828 Personal history of other malignant neoplasm of skin: Secondary | ICD-10-CM | POA: Diagnosis not present

## 2016-08-06 DIAGNOSIS — S59911A Unspecified injury of right forearm, initial encounter: Secondary | ICD-10-CM | POA: Diagnosis not present

## 2016-08-06 DIAGNOSIS — R079 Chest pain, unspecified: Secondary | ICD-10-CM | POA: Diagnosis not present

## 2016-09-11 ENCOUNTER — Ambulatory Visit
Admission: RE | Admit: 2016-09-11 | Discharge: 2016-09-11 | Disposition: A | Discharge status: Home / Self Care | Payer: Medicare Other | Source: Ambulatory Visit | Attending: Internal Medicine | Admitting: Internal Medicine

## 2016-09-11 ENCOUNTER — Other Ambulatory Visit: Payer: Self-pay | Admitting: Internal Medicine

## 2016-09-11 DIAGNOSIS — J209 Acute bronchitis, unspecified: Secondary | ICD-10-CM

## 2016-09-11 DIAGNOSIS — F0390 Unspecified dementia without behavioral disturbance: Secondary | ICD-10-CM | POA: Diagnosis not present

## 2016-09-11 DIAGNOSIS — E538 Deficiency of other specified B group vitamins: Secondary | ICD-10-CM | POA: Diagnosis not present

## 2016-09-11 DIAGNOSIS — R05 Cough: Secondary | ICD-10-CM | POA: Diagnosis not present

## 2016-09-11 DIAGNOSIS — I10 Essential (primary) hypertension: Secondary | ICD-10-CM | POA: Diagnosis not present

## 2016-09-11 DIAGNOSIS — J309 Allergic rhinitis, unspecified: Secondary | ICD-10-CM | POA: Diagnosis not present

## 2016-09-11 DIAGNOSIS — N183 Chronic kidney disease, stage 3 (moderate): Secondary | ICD-10-CM | POA: Diagnosis not present

## 2016-09-11 DIAGNOSIS — E7800 Pure hypercholesterolemia, unspecified: Secondary | ICD-10-CM | POA: Diagnosis not present

## 2016-09-26 DIAGNOSIS — I10 Essential (primary) hypertension: Secondary | ICD-10-CM | POA: Diagnosis not present

## 2016-09-26 DIAGNOSIS — Z0000 Encounter for general adult medical examination without abnormal findings: Secondary | ICD-10-CM | POA: Diagnosis not present

## 2016-09-26 DIAGNOSIS — E7800 Pure hypercholesterolemia, unspecified: Secondary | ICD-10-CM | POA: Diagnosis not present

## 2016-09-26 DIAGNOSIS — N183 Chronic kidney disease, stage 3 (moderate): Secondary | ICD-10-CM | POA: Diagnosis not present

## 2016-09-26 DIAGNOSIS — Z23 Encounter for immunization: Secondary | ICD-10-CM | POA: Diagnosis not present

## 2016-09-26 DIAGNOSIS — J309 Allergic rhinitis, unspecified: Secondary | ICD-10-CM | POA: Diagnosis not present

## 2016-09-26 DIAGNOSIS — F0390 Unspecified dementia without behavioral disturbance: Secondary | ICD-10-CM | POA: Diagnosis not present

## 2016-09-26 DIAGNOSIS — R600 Localized edema: Secondary | ICD-10-CM | POA: Diagnosis not present

## 2016-09-26 DIAGNOSIS — E538 Deficiency of other specified B group vitamins: Secondary | ICD-10-CM | POA: Diagnosis not present

## 2016-11-06 DIAGNOSIS — N390 Urinary tract infection, site not specified: Secondary | ICD-10-CM | POA: Diagnosis not present

## 2016-11-06 DIAGNOSIS — N183 Chronic kidney disease, stage 3 (moderate): Secondary | ICD-10-CM | POA: Diagnosis not present

## 2016-11-06 DIAGNOSIS — I129 Hypertensive chronic kidney disease with stage 1 through stage 4 chronic kidney disease, or unspecified chronic kidney disease: Secondary | ICD-10-CM | POA: Diagnosis not present

## 2016-12-11 DIAGNOSIS — I129 Hypertensive chronic kidney disease with stage 1 through stage 4 chronic kidney disease, or unspecified chronic kidney disease: Secondary | ICD-10-CM | POA: Diagnosis not present

## 2017-02-19 DIAGNOSIS — N390 Urinary tract infection, site not specified: Secondary | ICD-10-CM | POA: Diagnosis not present

## 2017-04-03 DIAGNOSIS — N183 Chronic kidney disease, stage 3 (moderate): Secondary | ICD-10-CM | POA: Diagnosis not present

## 2017-04-03 DIAGNOSIS — D631 Anemia in chronic kidney disease: Secondary | ICD-10-CM | POA: Diagnosis not present

## 2017-04-03 DIAGNOSIS — I129 Hypertensive chronic kidney disease with stage 1 through stage 4 chronic kidney disease, or unspecified chronic kidney disease: Secondary | ICD-10-CM | POA: Diagnosis not present

## 2017-04-03 DIAGNOSIS — N2581 Secondary hyperparathyroidism of renal origin: Secondary | ICD-10-CM | POA: Diagnosis not present

## 2017-04-15 DIAGNOSIS — C50911 Malignant neoplasm of unspecified site of right female breast: Secondary | ICD-10-CM | POA: Diagnosis not present

## 2017-04-15 DIAGNOSIS — Z853 Personal history of malignant neoplasm of breast: Secondary | ICD-10-CM | POA: Diagnosis not present

## 2017-06-07 DIAGNOSIS — E7800 Pure hypercholesterolemia, unspecified: Secondary | ICD-10-CM | POA: Diagnosis not present

## 2017-06-07 DIAGNOSIS — I10 Essential (primary) hypertension: Secondary | ICD-10-CM | POA: Diagnosis not present

## 2017-06-07 DIAGNOSIS — J309 Allergic rhinitis, unspecified: Secondary | ICD-10-CM | POA: Diagnosis not present

## 2017-06-07 DIAGNOSIS — F0390 Unspecified dementia without behavioral disturbance: Secondary | ICD-10-CM | POA: Diagnosis not present

## 2017-06-12 ENCOUNTER — Ambulatory Visit (INDEPENDENT_AMBULATORY_CARE_PROVIDER_SITE_OTHER): Payer: Self-pay | Admitting: Nurse Practitioner

## 2017-06-12 ENCOUNTER — Ambulatory Visit: Payer: Self-pay

## 2017-06-12 DIAGNOSIS — Z111 Encounter for screening for respiratory tuberculosis: Secondary | ICD-10-CM

## 2017-06-12 NOTE — Progress Notes (Signed)
Patient presents for PPD placement for Nursing home Denies previous positive TB test  Denies known exposure to TB   Tuberculin skin test applied to left ventral forearm.  Patient aware that she  needs to return in 48-72 hours for PPD reading.  Vaccine Information Statement provided to patient.    Patients daughter stated the patient just needs a TB test and the rest of her paperwork will be filled out by the patients provider at [facility]. Patients paperwork is at the nurses station.

## 2017-06-14 LAB — TB SKIN TEST
INDURATION: 0 mm
TB Skin Test: NEGATIVE

## 2017-07-05 DIAGNOSIS — S51801A Unspecified open wound of right forearm, initial encounter: Secondary | ICD-10-CM | POA: Diagnosis not present

## 2017-07-09 DIAGNOSIS — E538 Deficiency of other specified B group vitamins: Secondary | ICD-10-CM | POA: Diagnosis not present

## 2017-07-09 DIAGNOSIS — N2581 Secondary hyperparathyroidism of renal origin: Secondary | ICD-10-CM | POA: Diagnosis not present

## 2017-07-09 DIAGNOSIS — I129 Hypertensive chronic kidney disease with stage 1 through stage 4 chronic kidney disease, or unspecified chronic kidney disease: Secondary | ICD-10-CM | POA: Diagnosis not present

## 2017-07-09 DIAGNOSIS — N183 Chronic kidney disease, stage 3 (moderate): Secondary | ICD-10-CM | POA: Diagnosis not present

## 2017-07-16 ENCOUNTER — Ambulatory Visit: Payer: Self-pay | Admitting: Emergency Medicine

## 2017-07-16 VITALS — HR 82 | Temp 98.1°F | Resp 17

## 2017-07-16 DIAGNOSIS — Z4802 Encounter for removal of sutures: Secondary | ICD-10-CM

## 2017-07-16 DIAGNOSIS — N183 Chronic kidney disease, stage 3 (moderate): Secondary | ICD-10-CM | POA: Diagnosis not present

## 2017-07-16 NOTE — Patient Instructions (Signed)
Suture Removal, Care After Refer to this sheet in the next few weeks. These instructions provide you with information on caring for yourself after your procedure. Your health care provider may also give you more specific instructions. Your treatment has been planned according to current medical practices, but problems sometimes occur. Call your health care provider if you have any problems or questions after your procedure. What can I expect after the procedure? After your stitches (sutures) are removed, it is typical to have the following:  Some discomfort and swelling in the wound area.  Slight redness in the area.  Follow these instructions at home:  If you have skin adhesive strips over the wound area, do not take the strips off. They will fall off on their own in a few days. If the strips remain in place after 14 days, you may remove them.  Change any bandages (dressings) at least once a day or as directed by your health care provider. If the bandage sticks, soak it off with warm, soapy water.  Apply cream or ointment only as directed by your health care provider. If using cream or ointment, wash the area with soap and water 2 times a day to remove all the cream or ointment. Rinse off the soap and pat the area dry with a clean towel.  Keep the wound area dry and clean. If the bandage becomes wet or dirty, or if it develops a bad smell, change it as soon as possible.  Continue to protect the wound from injury.  Use sunscreen when out in the sun. New scars become sunburned easily. Contact a health care provider if:  You have increasing redness, swelling, or pain in the wound.  You see pus coming from the wound.  You have a fever.  You notice a bad smell coming from the wound or dressing.  Your wound breaks open (edges not staying together). This information is not intended to replace advice given to you by your health care provider. Make sure you discuss any questions you have  with your health care provider. Document Released: 05/15/2001 Document Revised: 01/26/2016 Document Reviewed: 04/01/2013 Elsevier Interactive Patient Education  2017 [employer]  

## 2017-07-16 NOTE — Progress Notes (Signed)
  Subjective:    Megan Gallegos is a 83 y.o. female who obtained a laceration 11 days ago, which required closure with 4 sutures. Mechanism of injury: fall at home.  [facility], She denies pain, redness, or drainage from the wound    Review of Systems Pertinent items are noted in HPI.    Objective:    Pulse 82   Temp 98.1 F (36.7 C) (Oral)   Resp 17   SpO2 97%  Injury exam:  A 3 cm laceration noted on the right forearm is healing well, without evidence of infection.    Assessment:    Laceration is healing well, without evidence of infection.    Plan:     1. 4 sutures were removed. 2. Wound care discussed. 3. Follow up as needed.

## 2017-07-18 ENCOUNTER — Telehealth: Payer: Self-pay | Admitting: Emergency Medicine

## 2017-08-14 DIAGNOSIS — H5203 Hypermetropia, bilateral: Secondary | ICD-10-CM | POA: Diagnosis not present

## 2017-08-19 DIAGNOSIS — R300 Dysuria: Secondary | ICD-10-CM | POA: Diagnosis not present

## 2017-08-19 DIAGNOSIS — Z7189 Other specified counseling: Secondary | ICD-10-CM | POA: Diagnosis not present

## 2017-08-25 ENCOUNTER — Emergency Department (HOSPITAL_COMMUNITY): Payer: Medicare Other

## 2017-08-25 ENCOUNTER — Emergency Department (HOSPITAL_COMMUNITY)
Admission: EM | Admit: 2017-08-25 | Discharge: 2017-08-25 | Disposition: A | Discharge status: Home / Self Care | Payer: Medicare Other | Attending: Emergency Medicine | Admitting: Emergency Medicine

## 2017-08-25 ENCOUNTER — Encounter (HOSPITAL_COMMUNITY): Payer: Self-pay | Admitting: Emergency Medicine

## 2017-08-25 DIAGNOSIS — S299XXA Unspecified injury of thorax, initial encounter: Secondary | ICD-10-CM | POA: Diagnosis not present

## 2017-08-25 DIAGNOSIS — I10 Essential (primary) hypertension: Secondary | ICD-10-CM | POA: Diagnosis not present

## 2017-08-25 DIAGNOSIS — Z79899 Other long term (current) drug therapy: Secondary | ICD-10-CM | POA: Insufficient documentation

## 2017-08-25 DIAGNOSIS — Y92128 Other place in nursing home as the place of occurrence of the external cause: Secondary | ICD-10-CM | POA: Insufficient documentation

## 2017-08-25 DIAGNOSIS — R9431 Abnormal electrocardiogram [ECG] [EKG]: Secondary | ICD-10-CM | POA: Diagnosis not present

## 2017-08-25 DIAGNOSIS — S42331A Displaced oblique fracture of shaft of humerus, right arm, initial encounter for closed fracture: Secondary | ICD-10-CM | POA: Diagnosis not present

## 2017-08-25 DIAGNOSIS — W19XXXA Unspecified fall, initial encounter: Secondary | ICD-10-CM | POA: Diagnosis not present

## 2017-08-25 DIAGNOSIS — Y998 Other external cause status: Secondary | ICD-10-CM | POA: Insufficient documentation

## 2017-08-25 DIAGNOSIS — R52 Pain, unspecified: Secondary | ICD-10-CM

## 2017-08-25 DIAGNOSIS — S42341A Displaced spiral fracture of shaft of humerus, right arm, initial encounter for closed fracture: Secondary | ICD-10-CM | POA: Insufficient documentation

## 2017-08-25 DIAGNOSIS — Y9389 Activity, other specified: Secondary | ICD-10-CM | POA: Insufficient documentation

## 2017-08-25 DIAGNOSIS — F0390 Unspecified dementia without behavioral disturbance: Secondary | ICD-10-CM | POA: Insufficient documentation

## 2017-08-25 DIAGNOSIS — S4991XA Unspecified injury of right shoulder and upper arm, initial encounter: Secondary | ICD-10-CM | POA: Diagnosis present

## 2017-08-25 DIAGNOSIS — M79601 Pain in right arm: Secondary | ICD-10-CM | POA: Diagnosis not present

## 2017-08-25 DIAGNOSIS — T148XXA Other injury of unspecified body region, initial encounter: Secondary | ICD-10-CM | POA: Diagnosis not present

## 2017-08-25 LAB — CBC WITH DIFFERENTIAL/PLATELET
Basophils Absolute: 0 10*3/uL (ref 0.0–0.1)
Basophils Relative: 0 %
EOS PCT: 1 %
Eosinophils Absolute: 0.1 10*3/uL (ref 0.0–0.7)
HCT: 32.7 % — ABNORMAL LOW (ref 36.0–46.0)
Hemoglobin: 10.9 g/dL — ABNORMAL LOW (ref 12.0–15.0)
LYMPHS ABS: 1.1 10*3/uL (ref 0.7–4.0)
Lymphocytes Relative: 8 %
MCH: 29.1 pg (ref 26.0–34.0)
MCHC: 33.3 g/dL (ref 30.0–36.0)
MCV: 87.2 fL (ref 78.0–100.0)
MONOS PCT: 6 %
Monocytes Absolute: 0.8 10*3/uL (ref 0.1–1.0)
Neutro Abs: 11.3 10*3/uL — ABNORMAL HIGH (ref 1.7–7.7)
Neutrophils Relative %: 85 %
PLATELETS: 368 10*3/uL (ref 150–400)
RBC: 3.75 MIL/uL — ABNORMAL LOW (ref 3.87–5.11)
RDW: 14.5 % (ref 11.5–15.5)
WBC: 13.4 10*3/uL — ABNORMAL HIGH (ref 4.0–10.5)

## 2017-08-25 LAB — BASIC METABOLIC PANEL
Anion gap: 10 (ref 5–15)
BUN: 50 mg/dL — AB (ref 6–20)
CHLORIDE: 98 mmol/L — AB (ref 101–111)
CO2: 22 mmol/L (ref 22–32)
CREATININE: 1.63 mg/dL — AB (ref 0.44–1.00)
Calcium: 8.7 mg/dL — ABNORMAL LOW (ref 8.9–10.3)
GFR calc Af Amer: 30 mL/min — ABNORMAL LOW (ref >60)
GFR calc non Af Amer: 26 mL/min — ABNORMAL LOW (ref >60)
GLUCOSE: 118 mg/dL — AB (ref 65–99)
Potassium: 4.5 mmol/L (ref 3.5–5.1)
SODIUM: 130 mmol/L — AB (ref 135–145)

## 2017-08-25 MED ORDER — ONDANSETRON HCL 4 MG/2ML IJ SOLN
4.0000 mg | Freq: Once | INTRAMUSCULAR | Status: AC
  Administered 2017-08-25: 4 mg via INTRAVENOUS
  Filled 2017-08-25: qty 2

## 2017-08-25 MED ORDER — MORPHINE SULFATE (PF) 4 MG/ML IV SOLN
2.0000 mg | Freq: Once | INTRAVENOUS | Status: AC
  Administered 2017-08-25: 2 mg via INTRAVENOUS
  Filled 2017-08-25: qty 1

## 2017-08-25 NOTE — ED Triage Notes (Signed)
Pt brought in by [facility] for an unwitnessed fall in her room. Pt lives at [facility] at [facility]. Pt has hx of dementia. Pt only c/o of right shoulder pain, deformity noted.

## 2017-08-25 NOTE — ED Provider Notes (Signed)
[facility] EMERGENCY DEPARTMENT Provider Note   CSN: [id number] Arrival date & time: 08/25/17  1700     History   Chief Complaint Chief Complaint  Patient presents with  . Fall  . Shoulder Injury    HPI Megan Gallegos is a 83 y.o. female.  83 yo F with a chief complaint of a fall.  The patient lives in a nursing home and had a unwitnessed fall.  The patient is not sure exactly what happened.  Complaining of pain to the right upper arm.  Denies head injury or loss of consciousness.  History is limited secondary to dementia.  Level 5 caveat dementia.   The history is provided by the patient.  Fall  This is a new problem. The current episode started 1 to 2 hours ago. The problem occurs constantly. The problem has not changed since onset.Pertinent negatives include no chest pain, no headaches and no shortness of breath. Nothing aggravates the symptoms. Nothing relieves the symptoms. She has tried nothing for the symptoms. The treatment provided no relief.  Shoulder Injury  Pertinent negatives include no chest pain, no headaches and no shortness of breath.    Past Medical History:  Diagnosis Date  . Cancer (HCC)    breast  . Hypertension   . Kidney disease   . Thyroid disease     Patient Active Problem List   Diagnosis Date Noted  . BURSITIS, RIGHT SHOULDER 11/11/2007    Past Surgical History:  Procedure Laterality Date  . MASTECTOMY     right    OB History    No data available       Home Medications    Prior to Admission medications   Medication Sig Start Date End Date Taking? Authorizing Provider  amLODipine (NORVASC) 10 MG tablet Take 10 mg by mouth daily.    [provider]  aspirin EC 81 MG tablet Take 81 mg by mouth every evening.    [provider]  b complex vitamins capsule Take 1 capsule by mouth every evening.    [provider]  Calcium Carbonate (CALTRATE 600 PO) Take 1 tablet by mouth daily.     [provider]  co-enzyme Q-10 30 MG capsule Take 30 mg by mouth daily with lunch.    [provider]  doxercalciferol (HECTOROL) 0.5 MCG capsule Take 0.5 mcg by mouth daily.    [provider]  fish oil-omega-3 fatty acids 1000 MG capsule Take 1 g by mouth 2 (two) times daily.    [provider]  Multiple Vitamins-Minerals (OCUVITE PRESERVISION PO) Take 1 tablet by mouth daily.    [provider]  telmisartan (MICARDIS) 80 MG tablet Take 80 mg by mouth daily.    [provider]    Family History No family history on file.  Social History Social History   Tobacco Use  . Smoking status: Never Smoker  . Smokeless tobacco: Never Used  Substance Use Topics  . Alcohol use: No  . Drug use: No     Allergies   Ceclor [cefaclor]   Review of Systems Review of Systems  Constitutional: Negative for chills and fever.  HENT: Negative for congestion and rhinorrhea.   Eyes: Negative for redness and visual disturbance.  Respiratory: Negative for shortness of breath and wheezing.   Cardiovascular: Negative for chest pain and palpitations.  Gastrointestinal: Negative for nausea and vomiting.  Genitourinary: Negative for dysuria and urgency.  Musculoskeletal: Positive for arthralgias and myalgias.  Skin: Negative for pallor and wound.  Neurological: Negative for dizziness and headaches.     Physical Exam Updated Vital Signs BP 135/73   Pulse 85   Temp 97.7 F (36.5 C)   Resp (!) 24   SpO2 96%   Physical Exam  Constitutional: She appears well-developed and well-nourished. No distress.  HENT:  Head: Normocephalic and atraumatic.  Eyes: EOM are normal. Pupils are equal, round, and reactive to light.  Neck: Normal range of motion. Neck supple.  Cardiovascular: Normal rate and regular rhythm. Exam reveals no gallop and no friction rub.  No murmur heard. Pulmonary/Chest: Effort normal. She has no wheezes. She has no rales.    Abdominal: Soft. She exhibits no distension. There is no tenderness.  Musculoskeletal: She exhibits edema, tenderness and deformity.  Deformity to the right mid arm.  Pain with palpation.  Intact pulse motor and sensation distally.  Palpated from head to toe without other noted areas of bony tenderness.  Neurological: She is alert.  Confused to scenario, time  Skin: Skin is warm and dry. She is not diaphoretic.  Psychiatric: She has a normal mood and affect. Her behavior is normal.  Nursing note and vitals reviewed.    ED Treatments / Results  Labs (all labs ordered are listed, but only abnormal results are displayed) Labs Reviewed  CBC WITH DIFFERENTIAL/PLATELET - Abnormal; Notable for the following components:      Result Value   WBC 13.4 (*)    RBC 3.75 (*)    Hemoglobin 10.9 (*)    HCT 32.7 (*)    Neutro Abs 11.3 (*)    All other components within normal limits  BASIC METABOLIC PANEL - Abnormal; Notable for the following components:   Sodium 130 (*)    Chloride 98 (*)    Glucose, Bld 118 (*)    BUN 50 (*)    Creatinine, Ser 1.63 (*)    Calcium 8.7 (*)    GFR calc non Af Amer 26 (*)    GFR calc Af Amer 30 (*)    All other components within normal limits    EKG  EKG Interpretation None       Radiology Dg Chest 1 View  Result Date: 08/25/2017 CLINICAL DATA:  Pt brought in by [facility] for an unwitnessed fall in her room. Pt lives at [facility] at [facility]. Pt has hx of dementia. Pt only c/o of right shoulder pain, deformity noted. EXAM: CHEST 1 VIEW COMPARISON:  09/11/2016 FINDINGS: There is an oblique fracture of the proximal to mid humeral shaft. There is mild comminution with a single butterfly fragment. Fracture is displaced, approximately 1.5 cm based on this single view. Cardiac silhouette is normal in size. No mediastinal or hilar masses. No evidence of adenopathy. There are increased markings in the lung bases, likely scarring, subsegmental atelectasis or a  combination. Lungs otherwise clear. No convincing pleural effusion. No pneumothorax. Skeletal structures are diffusely demineralized. IMPRESSION: 1. Fracture of the right humeral shaft. 2. No acute cardiopulmonary disease. Electronically Signed   By: Lajean Manes M.D.   On: 08/25/2017 18:19   Dg Humerus Right  Result Date: 08/25/2017 CLINICAL DATA:  Unwitnessed fall at nursing home today with deformity right upper arm. EXAM: RIGHT HUMERUS - 2+ VIEW COMPARISON:  None. FINDINGS: Examination demonstrates a displaced oblique fracture of the mid humeral diaphysis with posterolateral angulation of the distal fragment. Remainder of the exam is unremarkable. IMPRESSION: Displaced oblique fracture of the mid humeral diaphysis.  Electronically Signed   By: Marin Olp M.D.   On: 08/25/2017 18:17    Procedures Procedures (including critical care time)  Medications Ordered in ED Medications  morphine 4 MG/ML injection 2 mg (2 mg Intravenous Given 08/25/17 1929)  ondansetron (ZOFRAN) injection 4 mg (4 mg Intravenous Given 08/25/17 1929)     Initial Impression / Assessment and Plan / ED Course  I have reviewed the triage vital signs and the nursing notes.  Pertinent labs & imaging results that were available during my care of the patient were reviewed by me and considered in my medical decision making (see chart for details).     83 yo F with a chief complaint of right upper arm pain.  Clinically the patient has a midshaft humerus fracture.  Pulse motor and sensation is intact distally.  Will obtain a plain film.  Check labs EKG chest x-ray.  Discussed with orthopedics Dr. Ninfa Linden.  Recommends coaptation splint and follow up in the office.  Awaiting rest of workup.   Rest of the workup is unremarkable.  Ortho follow up.   9:57 PM:  I have discussed the diagnosis/risks/treatment options with the patient and family and believe the pt to be eligible for discharge home to follow-up with PCP. We also  discussed returning to the ED immediately if new or worsening sx occur. We discussed the sx which are most concerning (e.g., sudden worsening pain, fever, inability to tolerate by mouth) that necessitate immediate return. Medications administered to the patient during their visit and any new prescriptions provided to the patient are listed below.  Medications given during this visit Medications  morphine 4 MG/ML injection 2 mg (2 mg Intravenous Given 08/25/17 1929)  ondansetron (ZOFRAN) injection 4 mg (4 mg Intravenous Given 08/25/17 1929)     The patient appears reasonably screen and/or stabilized for discharge and I doubt any other medical condition or other [facility] requiring further screening, evaluation, or treatment in the ED at this time prior to discharge.     Final Clinical Impressions(s) / ED Diagnoses   Final diagnoses:  Closed displaced spiral fracture of shaft of right humerus, initial encounter    ED Discharge Orders    None       Deno Etienne, DO 08/25/17 2157

## 2017-08-25 NOTE — Progress Notes (Signed)
Orthopedic Tech Progress Note Patient Details:  Megan Gallegos 1919-11-01 [id number]  Ortho Devices Type of Ortho Device: Ace wrap, Coapt Ortho Device/Splint Location: RUE Ortho Device/Splint Interventions: Ordered, Application   Post Interventions Patient Tolerated: Fair Instructions Provided: Care of device   Braulio Bosch 08/25/2017, 7:24 PM

## 2017-08-25 NOTE — ED Notes (Signed)
Patient transported to X-ray 

## 2017-08-28 DIAGNOSIS — M25511 Pain in right shoulder: Secondary | ICD-10-CM | POA: Diagnosis not present

## 2017-09-04 DIAGNOSIS — M25511 Pain in right shoulder: Secondary | ICD-10-CM | POA: Diagnosis not present

## 2017-09-04 DIAGNOSIS — N183 Chronic kidney disease, stage 3 (moderate): Secondary | ICD-10-CM | POA: Diagnosis not present

## 2017-09-13 DIAGNOSIS — F0390 Unspecified dementia without behavioral disturbance: Secondary | ICD-10-CM | POA: Diagnosis not present

## 2017-09-13 DIAGNOSIS — S42309A Unspecified fracture of shaft of humerus, unspecified arm, initial encounter for closed fracture: Secondary | ICD-10-CM | POA: Diagnosis not present

## 2017-09-13 DIAGNOSIS — I10 Essential (primary) hypertension: Secondary | ICD-10-CM | POA: Diagnosis not present

## 2017-09-13 DIAGNOSIS — G2589 Other specified extrapyramidal and movement disorders: Secondary | ICD-10-CM | POA: Diagnosis not present

## 2017-09-16 DIAGNOSIS — M25511 Pain in right shoulder: Secondary | ICD-10-CM | POA: Diagnosis not present

## 2017-09-18 DIAGNOSIS — C50911 Malignant neoplasm of unspecified site of right female breast: Secondary | ICD-10-CM | POA: Diagnosis not present

## 2017-09-30 DIAGNOSIS — M25511 Pain in right shoulder: Secondary | ICD-10-CM | POA: Diagnosis not present

## 2017-10-15 DIAGNOSIS — E7800 Pure hypercholesterolemia, unspecified: Secondary | ICD-10-CM | POA: Diagnosis not present

## 2017-10-15 DIAGNOSIS — J309 Allergic rhinitis, unspecified: Secondary | ICD-10-CM | POA: Diagnosis not present

## 2017-10-15 DIAGNOSIS — I10 Essential (primary) hypertension: Secondary | ICD-10-CM | POA: Diagnosis not present

## 2017-10-15 DIAGNOSIS — Z0000 Encounter for general adult medical examination without abnormal findings: Secondary | ICD-10-CM | POA: Diagnosis not present

## 2017-10-15 DIAGNOSIS — R350 Frequency of micturition: Secondary | ICD-10-CM | POA: Diagnosis not present

## 2017-11-05 DIAGNOSIS — Z8639 Personal history of other endocrine, nutritional and metabolic disease: Secondary | ICD-10-CM | POA: Diagnosis not present

## 2017-11-05 DIAGNOSIS — N2581 Secondary hyperparathyroidism of renal origin: Secondary | ICD-10-CM | POA: Diagnosis not present

## 2017-11-05 DIAGNOSIS — N183 Chronic kidney disease, stage 3 (moderate): Secondary | ICD-10-CM | POA: Diagnosis not present

## 2017-11-05 DIAGNOSIS — N390 Urinary tract infection, site not specified: Secondary | ICD-10-CM | POA: Diagnosis not present

## 2017-11-06 DIAGNOSIS — N183 Chronic kidney disease, stage 3 (moderate): Secondary | ICD-10-CM | POA: Diagnosis not present

## 2017-11-06 DIAGNOSIS — N390 Urinary tract infection, site not specified: Secondary | ICD-10-CM | POA: Diagnosis not present

## 2017-11-06 DIAGNOSIS — M25511 Pain in right shoulder: Secondary | ICD-10-CM | POA: Diagnosis not present

## 2018-01-30 DIAGNOSIS — C50911 Malignant neoplasm of unspecified site of right female breast: Secondary | ICD-10-CM | POA: Diagnosis not present

## 2018-04-17 NOTE — Telephone Encounter (Signed)
error 

## 2018-06-18 IMAGING — CR DG CHEST 2V
2 series · 2 of 2 positions shown · non-contrast
Comparison: 05/07/2016

CLINICAL DATA: Cough and congestion for several days

EXAM:
CHEST  2 VIEW

[w chest pa]
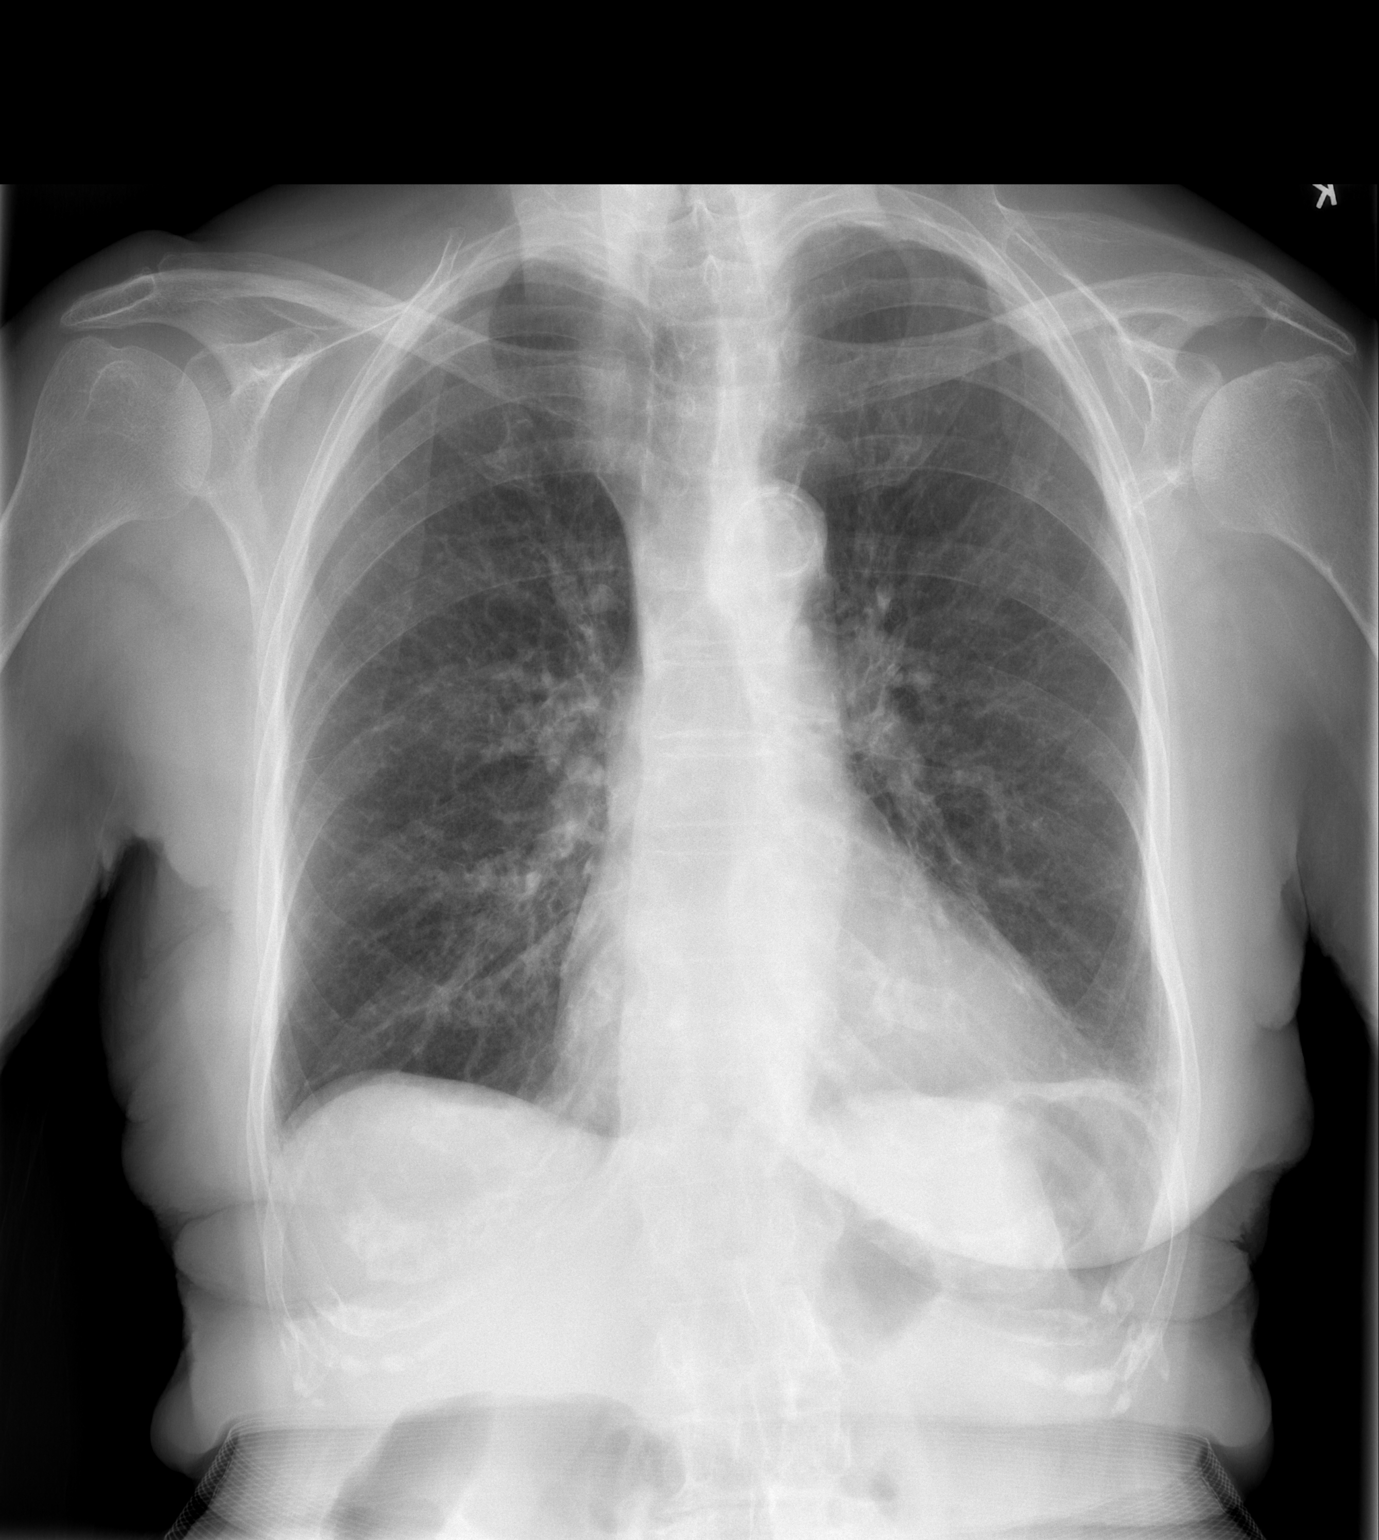

[w chest lat]
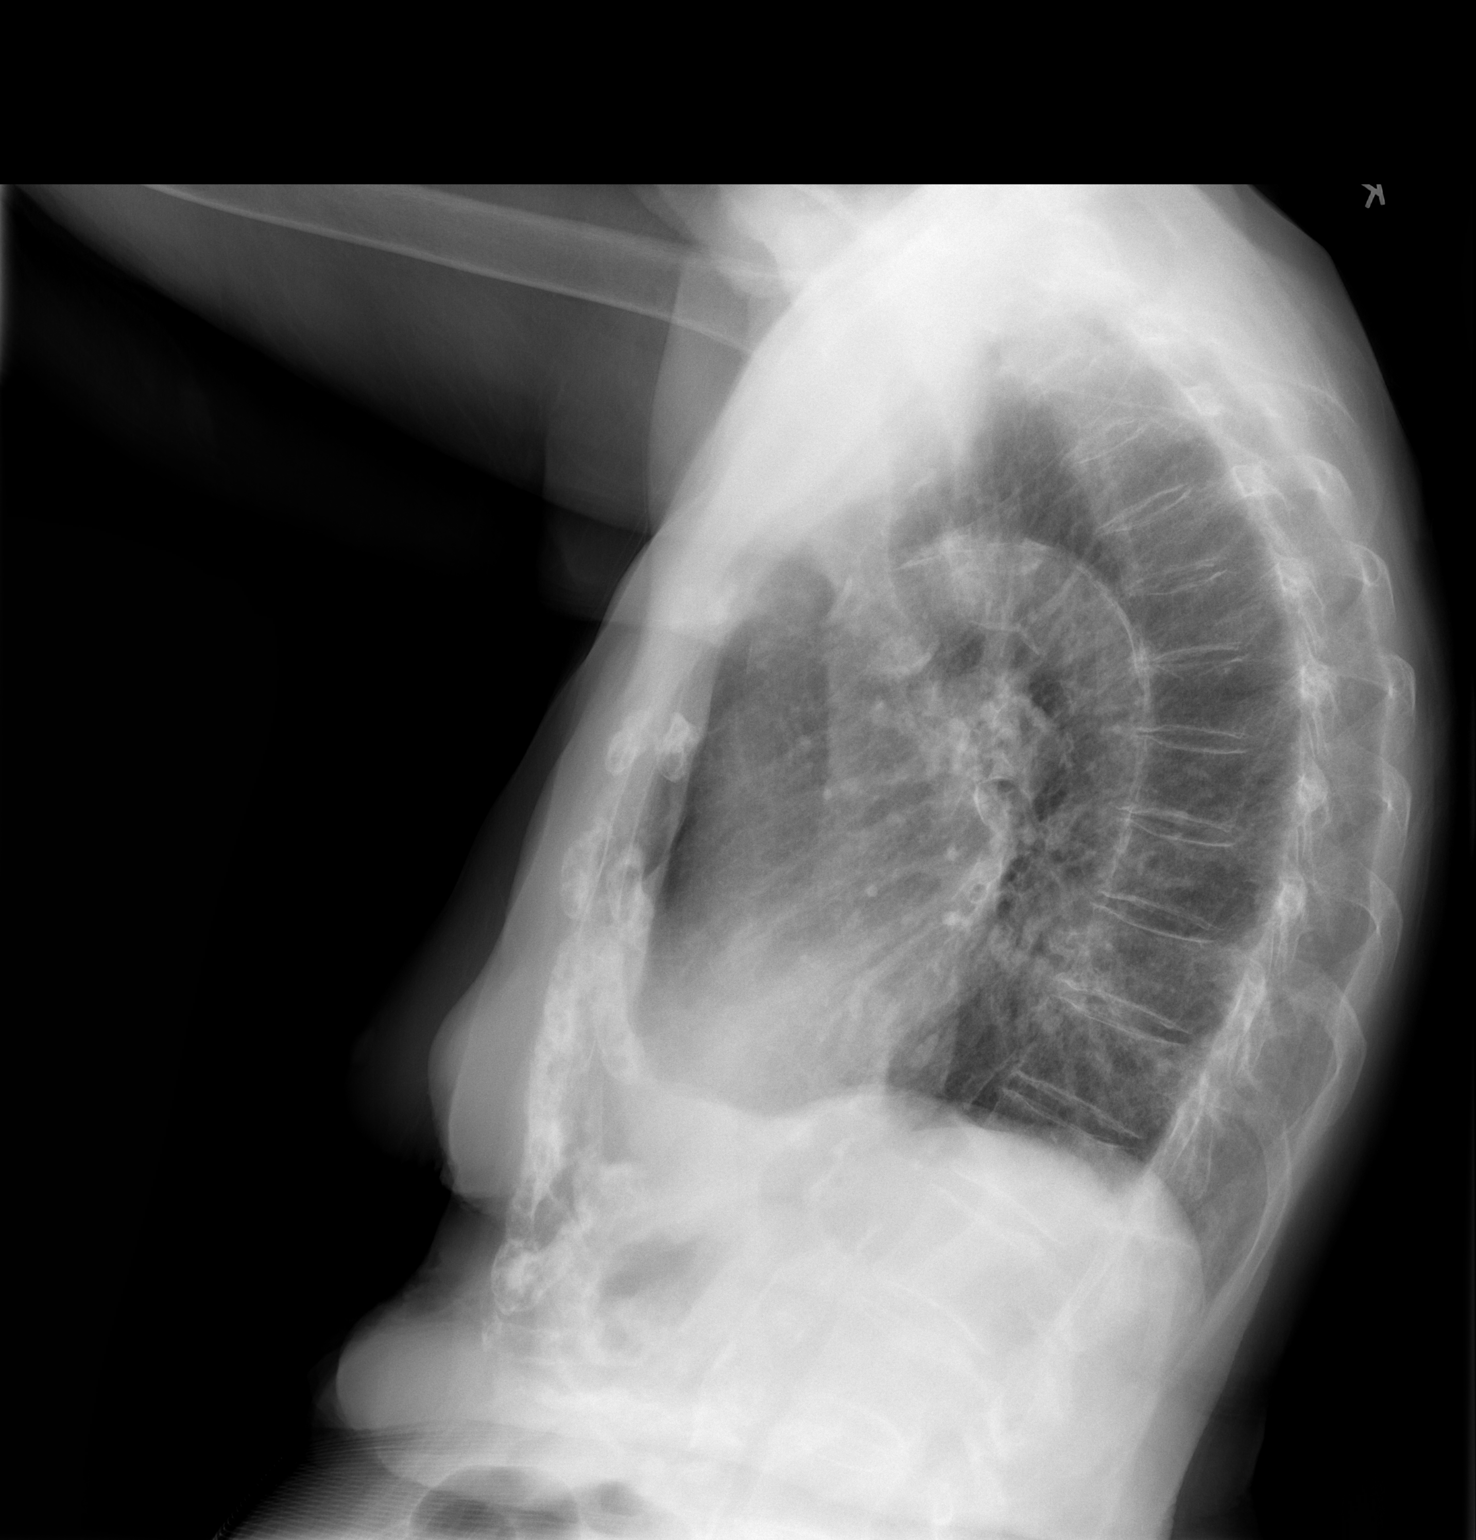

[2 of 2 positions shown; findings below may reference images not displayed]

FINDINGS: Cardiac shadow is stable. Aortic calcifications are again seen. The
lungs are well aerated bilaterally. No focal infiltrate or sizable
effusion is seen. No bony abnormality is noted.
IMPRESSION: No active cardiopulmonary disease.

## 2018-07-10 DIAGNOSIS — I10 Essential (primary) hypertension: Secondary | ICD-10-CM | POA: Diagnosis not present

## 2018-07-10 DIAGNOSIS — R350 Frequency of micturition: Secondary | ICD-10-CM | POA: Diagnosis not present

## 2018-07-10 DIAGNOSIS — N183 Chronic kidney disease, stage 3 (moderate): Secondary | ICD-10-CM | POA: Diagnosis not present

## 2018-07-10 DIAGNOSIS — F0390 Unspecified dementia without behavioral disturbance: Secondary | ICD-10-CM | POA: Diagnosis not present

## 2018-07-24 DIAGNOSIS — M549 Dorsalgia, unspecified: Secondary | ICD-10-CM | POA: Diagnosis not present

## 2018-08-14 DIAGNOSIS — H5203 Hypermetropia, bilateral: Secondary | ICD-10-CM | POA: Diagnosis not present

## 2019-05-04 DIAGNOSIS — I10 Essential (primary) hypertension: Secondary | ICD-10-CM | POA: Diagnosis not present

## 2019-05-04 DIAGNOSIS — W19XXXA Unspecified fall, initial encounter: Secondary | ICD-10-CM | POA: Diagnosis not present

## 2019-05-04 DIAGNOSIS — L309 Dermatitis, unspecified: Secondary | ICD-10-CM | POA: Diagnosis not present

## 2019-05-28 DIAGNOSIS — Z23 Encounter for immunization: Secondary | ICD-10-CM | POA: Diagnosis not present

## 2019-08-14 ENCOUNTER — Ambulatory Visit (INDEPENDENT_AMBULATORY_CARE_PROVIDER_SITE_OTHER): Payer: Medicare Other | Admitting: Podiatry

## 2019-08-14 ENCOUNTER — Other Ambulatory Visit: Payer: Self-pay

## 2019-08-14 ENCOUNTER — Encounter: Payer: Self-pay | Admitting: Podiatry

## 2019-08-14 DIAGNOSIS — M79675 Pain in left toe(s): Secondary | ICD-10-CM | POA: Diagnosis not present

## 2019-08-14 DIAGNOSIS — B351 Tinea unguium: Secondary | ICD-10-CM

## 2019-08-14 DIAGNOSIS — M79674 Pain in right toe(s): Secondary | ICD-10-CM

## 2019-08-14 DIAGNOSIS — M25561 Pain in right knee: Secondary | ICD-10-CM | POA: Diagnosis not present

## 2019-08-14 NOTE — Patient Instructions (Addendum)

## 2019-08-19 DIAGNOSIS — Z20828 Contact with and (suspected) exposure to other viral communicable diseases: Secondary | ICD-10-CM | POA: Diagnosis not present

## 2019-08-19 DIAGNOSIS — Z1159 Encounter for screening for other viral diseases: Secondary | ICD-10-CM | POA: Diagnosis not present

## 2019-08-23 NOTE — Progress Notes (Signed)
Subjective: Megan Gallegos presents today for longstanding painful, discolored, thick toenails b/l great and 2nd toeswhich interfere with daily activities.  Pain is aggravated when wearing enclosed shoe gear.   Megan Gallegos is accompanied by her daughter as she has h/o dementia. Daughter states she has been able to trim most of her mom's toenails, but has problems with the great toes and 2nd digits due to thickness.   Past Medical History:  Diagnosis Date  . Cancer (HCC)    breast  . Hypertension   . Kidney disease   . Thyroid disease      Patient Active Problem List   Diagnosis Date Noted  . BURSITIS, RIGHT SHOULDER 11/11/2007     Past Surgical History:  Procedure Laterality Date  . MASTECTOMY     right     Current Outpatient Medications on File Prior to Visit  Medication Sig Dispense Refill  . amLODipine (NORVASC) 10 MG tablet Take 10 mg by mouth daily.    Marland Kitchen amLODipine (NORVASC) 5 MG tablet Take 5 mg by mouth daily.    Marland Kitchen aspirin EC 81 MG tablet Take 81 mg by mouth every evening.    Marland Kitchen b complex vitamins capsule Take 1 capsule by mouth every evening.    . calcitRIOL (ROCALTROL) 0.25 MCG capsule     . Calcium Carbonate (CALTRATE 600 PO) Take 1 tablet by mouth daily.    Marland Kitchen co-enzyme Q-10 30 MG capsule Take 30 mg by mouth daily with lunch.    . doxercalciferol (HECTOROL) 0.5 MCG capsule Take 0.5 mcg by mouth daily.    . fish oil-omega-3 fatty acids 1000 MG capsule Take 1 g by mouth 2 (two) times daily.    . furosemide (LASIX) 40 MG tablet Take 40 mg by mouth daily.    Marland Kitchen losartan-hydrochlorothiazide (HYZAAR) 100-12.5 MG tablet Take by mouth.    . Multiple Vitamins-Minerals (OCUVITE PRESERVISION PO) Take 1 tablet by mouth daily.    Marland Kitchen telmisartan (MICARDIS) 80 MG tablet Take 80 mg by mouth daily.    Marland Kitchen triamcinolone cream (KENALOG) 0.1 % Apply 1 application topically 2 (two) times daily.     No current facility-administered medications on file prior to visit.     Allergies   Allergen Reactions  . Ceclor [Cefaclor]      Social History   Occupational History  . Not on file  Tobacco Use  . Smoking status: Never Smoker  . Smokeless tobacco: Never Used  Substance and Sexual Activity  . Alcohol use: No  . Drug use: No  . Sexual activity: Not on file     History reviewed. No pertinent family history.   Immunization History  Administered Date(s) Administered  . PPD Test 06/20/2017  . Tdap 10/11/2012     Review of systems: Positive Findings in bold print.  Constitutional:  chills, fatigue, fever, sweats, weight change Communication: Optometrist, sign Ecologist, hand writing, iPad/Android device Head: headaches, head injury Eyes: changes in vision, eye pain, glaucoma, cataracts, macular degeneration, diplopia, glare,  light sensitivity, eyeglasses or contacts, blindness Ears nose mouth throat: hearing impaired, hearing aids,  ringing in ears, deaf, sign language,  vertigo,   nosebleeds,  rhinitis,  cold sores, snoring, swollen glands Cardiovascular: HTN, edema, arrhythmia, pacemaker in place, defibrillator in place, chest pain/tightness, chronic anticoagulation, blood clot, heart failure, MI Peripheral Vascular: leg cramps, varicose veins, blood clots, lymphedema, varicosities Respiratory:  difficulty breathing, denies congestion, SOB, wheezing, cough, emphysema Gastrointestinal: change in appetite or weight, abdominal pain, constipation,  diarrhea, nausea, vomiting, vomiting blood, change in bowel habits, abdominal pain, jaundice, rectal bleeding, hemorrhoids, GERD Genitourinary:  nocturia,  pain on urination, polyuria,  blood in urine, Foley catheter, urinary urgency, ESRD on hemodialysis Musculoskeletal: amputation, cramping, stiff joints, painful joints, decreased joint motion, fractures, OA, gout, hemiplegia, paraplegia, uses cane, wheelchair bound, uses walker, uses rollator Skin: +changes in toenails, color change, dryness, itching, mole  changes,  rash, wound(s) Neurological: headaches, numbness in feet, paresthesias in feet, burning in feet, fainting,  seizures, change in speech. denies headaches, memory problems/poor historian, cerebral palsy, weakness, paralysis, CVA, TIA Endocrine: diabetes, hypothyroidism, hyperthyroidism,  goiter, dry mouth, flushing, heat intolerance,  cold intolerance,  excessive thirst, denies polyuria,  nocturia Hematological:  easy bleeding, excessive bleeding, easy bruising, enlarged lymph nodes, on long term blood thinner, history of past transusions Allergy/immunological:  hives, eczema, frequent infections, multiple drug allergies, seasonal allergies, transplant recipient, multiple food allergies Psychiatric:  anxiety, depression, mood disorder, suicidal ideations, hallucinations, insomnia  Objective: Unable to obtain vitals due to agitation.  She is AAO x 1. In NAD, but anxious constantly asking to leave. She is easily redirected with her daughter's assistance.  There were no vitals filed for this visit.  Vascular Examination: Capillary refill time immediate x 10 digits.  Dorsalis pedis pulses palpable b/l.   Posterior tibial pulses palpable b/l.   No digital hair x 10 digits.  Skin temperature gradient WNL b/l.  Dermatological Examination: Skin with normal turgor, texture and tone b/l.  Toenails b/l great toes and 2nd digits are discolored, thick, dystrophic with subungual debris and pain with palpation to nailbeds due to thickness of nails.  Musculoskeletal: HAV with bunion b/l.  Neurological: UTA protective sensation and vibratory sensation due to patient's inability to follow commands.  Assessment: Painful onychomycosis toenails b/l great/2nd toes Plan: 1. Discussed onychomycosis and treatment options.  Literature dispensed on today. 2. Toenails b/l great toes and 2nd digits were debrided in length and girth without iatrogenic bleeding. 3. Patient to continue soft, supportive  shoe gear daily. 4. Patient to report any pedal injuries to medical professional immediately. 5. Follow up 3 months.  6. Patient/POA to call should there be a concern in the interim.

## 2019-08-31 DIAGNOSIS — Z20828 Contact with and (suspected) exposure to other viral communicable diseases: Secondary | ICD-10-CM | POA: Diagnosis not present

## 2019-08-31 DIAGNOSIS — Z1159 Encounter for screening for other viral diseases: Secondary | ICD-10-CM | POA: Diagnosis not present

## 2019-09-07 DIAGNOSIS — Z20828 Contact with and (suspected) exposure to other viral communicable diseases: Secondary | ICD-10-CM | POA: Diagnosis not present

## 2019-09-07 DIAGNOSIS — Z1159 Encounter for screening for other viral diseases: Secondary | ICD-10-CM | POA: Diagnosis not present

## 2019-09-10 DIAGNOSIS — Z20828 Contact with and (suspected) exposure to other viral communicable diseases: Secondary | ICD-10-CM | POA: Diagnosis not present

## 2019-09-10 DIAGNOSIS — Z1159 Encounter for screening for other viral diseases: Secondary | ICD-10-CM | POA: Diagnosis not present

## 2019-09-14 DIAGNOSIS — Z20828 Contact with and (suspected) exposure to other viral communicable diseases: Secondary | ICD-10-CM | POA: Diagnosis not present

## 2019-09-14 DIAGNOSIS — Z1159 Encounter for screening for other viral diseases: Secondary | ICD-10-CM | POA: Diagnosis not present

## 2019-09-17 DIAGNOSIS — Z20828 Contact with and (suspected) exposure to other viral communicable diseases: Secondary | ICD-10-CM | POA: Diagnosis not present

## 2019-09-17 DIAGNOSIS — Z1159 Encounter for screening for other viral diseases: Secondary | ICD-10-CM | POA: Diagnosis not present

## 2019-09-21 DIAGNOSIS — Z1159 Encounter for screening for other viral diseases: Secondary | ICD-10-CM | POA: Diagnosis not present

## 2019-09-21 DIAGNOSIS — Z20828 Contact with and (suspected) exposure to other viral communicable diseases: Secondary | ICD-10-CM | POA: Diagnosis not present

## 2019-09-24 DIAGNOSIS — Z20828 Contact with and (suspected) exposure to other viral communicable diseases: Secondary | ICD-10-CM | POA: Diagnosis not present

## 2019-09-24 DIAGNOSIS — Z1159 Encounter for screening for other viral diseases: Secondary | ICD-10-CM | POA: Diagnosis not present

## 2019-09-28 DIAGNOSIS — Z1159 Encounter for screening for other viral diseases: Secondary | ICD-10-CM | POA: Diagnosis not present

## 2019-09-28 DIAGNOSIS — Z20828 Contact with and (suspected) exposure to other viral communicable diseases: Secondary | ICD-10-CM | POA: Diagnosis not present

## 2019-10-01 DIAGNOSIS — Z20828 Contact with and (suspected) exposure to other viral communicable diseases: Secondary | ICD-10-CM | POA: Diagnosis not present

## 2019-10-01 DIAGNOSIS — Z1159 Encounter for screening for other viral diseases: Secondary | ICD-10-CM | POA: Diagnosis not present

## 2019-11-18 ENCOUNTER — Ambulatory Visit: Payer: Medicare Other | Admitting: Podiatry

## 2019-11-30 DIAGNOSIS — Z1159 Encounter for screening for other viral diseases: Secondary | ICD-10-CM | POA: Diagnosis not present

## 2019-11-30 DIAGNOSIS — Z20828 Contact with and (suspected) exposure to other viral communicable diseases: Secondary | ICD-10-CM | POA: Diagnosis not present

## 2019-12-28 DIAGNOSIS — Z1159 Encounter for screening for other viral diseases: Secondary | ICD-10-CM | POA: Diagnosis not present

## 2019-12-28 DIAGNOSIS — Z20828 Contact with and (suspected) exposure to other viral communicable diseases: Secondary | ICD-10-CM | POA: Diagnosis not present

## 2020-01-25 DIAGNOSIS — Z20828 Contact with and (suspected) exposure to other viral communicable diseases: Secondary | ICD-10-CM | POA: Diagnosis not present

## 2020-01-25 DIAGNOSIS — Z1159 Encounter for screening for other viral diseases: Secondary | ICD-10-CM | POA: Diagnosis not present

## 2020-01-28 DIAGNOSIS — Z1159 Encounter for screening for other viral diseases: Secondary | ICD-10-CM | POA: Diagnosis not present

## 2020-01-28 DIAGNOSIS — Z20828 Contact with and (suspected) exposure to other viral communicable diseases: Secondary | ICD-10-CM | POA: Diagnosis not present

## 2020-02-01 DIAGNOSIS — Z20828 Contact with and (suspected) exposure to other viral communicable diseases: Secondary | ICD-10-CM | POA: Diagnosis not present

## 2020-02-01 DIAGNOSIS — Z1159 Encounter for screening for other viral diseases: Secondary | ICD-10-CM | POA: Diagnosis not present

## 2020-02-04 DIAGNOSIS — Z1159 Encounter for screening for other viral diseases: Secondary | ICD-10-CM | POA: Diagnosis not present

## 2020-02-04 DIAGNOSIS — Z20828 Contact with and (suspected) exposure to other viral communicable diseases: Secondary | ICD-10-CM | POA: Diagnosis not present

## 2020-02-08 DIAGNOSIS — Z1159 Encounter for screening for other viral diseases: Secondary | ICD-10-CM | POA: Diagnosis not present

## 2020-02-08 DIAGNOSIS — Z20828 Contact with and (suspected) exposure to other viral communicable diseases: Secondary | ICD-10-CM | POA: Diagnosis not present

## 2020-02-11 DIAGNOSIS — Z1159 Encounter for screening for other viral diseases: Secondary | ICD-10-CM | POA: Diagnosis not present

## 2020-02-11 DIAGNOSIS — Z20828 Contact with and (suspected) exposure to other viral communicable diseases: Secondary | ICD-10-CM | POA: Diagnosis not present

## 2020-02-15 DIAGNOSIS — Z20828 Contact with and (suspected) exposure to other viral communicable diseases: Secondary | ICD-10-CM | POA: Diagnosis not present

## 2020-02-15 DIAGNOSIS — Z1159 Encounter for screening for other viral diseases: Secondary | ICD-10-CM | POA: Diagnosis not present

## 2020-02-18 DIAGNOSIS — Z1159 Encounter for screening for other viral diseases: Secondary | ICD-10-CM | POA: Diagnosis not present

## 2020-02-18 DIAGNOSIS — Z20828 Contact with and (suspected) exposure to other viral communicable diseases: Secondary | ICD-10-CM | POA: Diagnosis not present

## 2020-02-22 DIAGNOSIS — Z1159 Encounter for screening for other viral diseases: Secondary | ICD-10-CM | POA: Diagnosis not present

## 2020-02-22 DIAGNOSIS — Z20828 Contact with and (suspected) exposure to other viral communicable diseases: Secondary | ICD-10-CM | POA: Diagnosis not present

## 2020-02-25 DIAGNOSIS — Z1159 Encounter for screening for other viral diseases: Secondary | ICD-10-CM | POA: Diagnosis not present

## 2020-02-25 DIAGNOSIS — Z20828 Contact with and (suspected) exposure to other viral communicable diseases: Secondary | ICD-10-CM | POA: Diagnosis not present

## 2020-02-29 DIAGNOSIS — Z1159 Encounter for screening for other viral diseases: Secondary | ICD-10-CM | POA: Diagnosis not present

## 2020-02-29 DIAGNOSIS — Z20828 Contact with and (suspected) exposure to other viral communicable diseases: Secondary | ICD-10-CM | POA: Diagnosis not present

## 2020-03-07 DIAGNOSIS — Z20828 Contact with and (suspected) exposure to other viral communicable diseases: Secondary | ICD-10-CM | POA: Diagnosis not present

## 2020-03-07 DIAGNOSIS — Z1159 Encounter for screening for other viral diseases: Secondary | ICD-10-CM | POA: Diagnosis not present

## 2020-03-10 DIAGNOSIS — Z20828 Contact with and (suspected) exposure to other viral communicable diseases: Secondary | ICD-10-CM | POA: Diagnosis not present

## 2020-03-10 DIAGNOSIS — Z1159 Encounter for screening for other viral diseases: Secondary | ICD-10-CM | POA: Diagnosis not present

## 2020-03-14 DIAGNOSIS — Z1159 Encounter for screening for other viral diseases: Secondary | ICD-10-CM | POA: Diagnosis not present

## 2020-03-14 DIAGNOSIS — Z20828 Contact with and (suspected) exposure to other viral communicable diseases: Secondary | ICD-10-CM | POA: Diagnosis not present

## 2020-03-17 DIAGNOSIS — Z20828 Contact with and (suspected) exposure to other viral communicable diseases: Secondary | ICD-10-CM | POA: Diagnosis not present

## 2020-03-17 DIAGNOSIS — Z1159 Encounter for screening for other viral diseases: Secondary | ICD-10-CM | POA: Diagnosis not present

## 2020-03-21 DIAGNOSIS — Z1159 Encounter for screening for other viral diseases: Secondary | ICD-10-CM | POA: Diagnosis not present

## 2020-03-21 DIAGNOSIS — Z20828 Contact with and (suspected) exposure to other viral communicable diseases: Secondary | ICD-10-CM | POA: Diagnosis not present

## 2020-03-24 DIAGNOSIS — Z20828 Contact with and (suspected) exposure to other viral communicable diseases: Secondary | ICD-10-CM | POA: Diagnosis not present

## 2020-03-24 DIAGNOSIS — Z1159 Encounter for screening for other viral diseases: Secondary | ICD-10-CM | POA: Diagnosis not present

## 2020-03-31 DIAGNOSIS — Z1159 Encounter for screening for other viral diseases: Secondary | ICD-10-CM | POA: Diagnosis not present

## 2020-03-31 DIAGNOSIS — Z20828 Contact with and (suspected) exposure to other viral communicable diseases: Secondary | ICD-10-CM | POA: Diagnosis not present

## 2020-04-04 DIAGNOSIS — Z20828 Contact with and (suspected) exposure to other viral communicable diseases: Secondary | ICD-10-CM | POA: Diagnosis not present

## 2020-04-04 DIAGNOSIS — Z1159 Encounter for screening for other viral diseases: Secondary | ICD-10-CM | POA: Diagnosis not present

## 2020-04-07 DIAGNOSIS — Z1159 Encounter for screening for other viral diseases: Secondary | ICD-10-CM | POA: Diagnosis not present

## 2020-04-07 DIAGNOSIS — Z20828 Contact with and (suspected) exposure to other viral communicable diseases: Secondary | ICD-10-CM | POA: Diagnosis not present

## 2020-04-11 DIAGNOSIS — Z1159 Encounter for screening for other viral diseases: Secondary | ICD-10-CM | POA: Diagnosis not present

## 2020-04-11 DIAGNOSIS — Z20828 Contact with and (suspected) exposure to other viral communicable diseases: Secondary | ICD-10-CM | POA: Diagnosis not present

## 2020-04-14 DIAGNOSIS — Z20828 Contact with and (suspected) exposure to other viral communicable diseases: Secondary | ICD-10-CM | POA: Diagnosis not present

## 2020-04-14 DIAGNOSIS — Z1159 Encounter for screening for other viral diseases: Secondary | ICD-10-CM | POA: Diagnosis not present

## 2020-04-18 DIAGNOSIS — Z20828 Contact with and (suspected) exposure to other viral communicable diseases: Secondary | ICD-10-CM | POA: Diagnosis not present

## 2020-04-18 DIAGNOSIS — Z1159 Encounter for screening for other viral diseases: Secondary | ICD-10-CM | POA: Diagnosis not present

## 2020-04-21 DIAGNOSIS — Z1159 Encounter for screening for other viral diseases: Secondary | ICD-10-CM | POA: Diagnosis not present

## 2020-04-21 DIAGNOSIS — Z20828 Contact with and (suspected) exposure to other viral communicable diseases: Secondary | ICD-10-CM | POA: Diagnosis not present

## 2020-04-25 DIAGNOSIS — Z1159 Encounter for screening for other viral diseases: Secondary | ICD-10-CM | POA: Diagnosis not present

## 2020-04-25 DIAGNOSIS — Z20828 Contact with and (suspected) exposure to other viral communicable diseases: Secondary | ICD-10-CM | POA: Diagnosis not present

## 2020-04-28 DIAGNOSIS — Z1159 Encounter for screening for other viral diseases: Secondary | ICD-10-CM | POA: Diagnosis not present

## 2020-04-28 DIAGNOSIS — Z20828 Contact with and (suspected) exposure to other viral communicable diseases: Secondary | ICD-10-CM | POA: Diagnosis not present

## 2020-05-02 DIAGNOSIS — Z1159 Encounter for screening for other viral diseases: Secondary | ICD-10-CM | POA: Diagnosis not present

## 2020-05-02 DIAGNOSIS — Z20828 Contact with and (suspected) exposure to other viral communicable diseases: Secondary | ICD-10-CM | POA: Diagnosis not present

## 2020-05-05 DIAGNOSIS — Z1159 Encounter for screening for other viral diseases: Secondary | ICD-10-CM | POA: Diagnosis not present

## 2020-05-05 DIAGNOSIS — Z20828 Contact with and (suspected) exposure to other viral communicable diseases: Secondary | ICD-10-CM | POA: Diagnosis not present

## 2020-05-09 DIAGNOSIS — Z1159 Encounter for screening for other viral diseases: Secondary | ICD-10-CM | POA: Diagnosis not present

## 2020-05-09 DIAGNOSIS — Z20828 Contact with and (suspected) exposure to other viral communicable diseases: Secondary | ICD-10-CM | POA: Diagnosis not present

## 2020-05-10 DIAGNOSIS — Z23 Encounter for immunization: Secondary | ICD-10-CM | POA: Diagnosis not present

## 2020-05-12 DIAGNOSIS — Z20828 Contact with and (suspected) exposure to other viral communicable diseases: Secondary | ICD-10-CM | POA: Diagnosis not present

## 2020-05-12 DIAGNOSIS — Z1159 Encounter for screening for other viral diseases: Secondary | ICD-10-CM | POA: Diagnosis not present

## 2020-05-16 DIAGNOSIS — Z20828 Contact with and (suspected) exposure to other viral communicable diseases: Secondary | ICD-10-CM | POA: Diagnosis not present

## 2020-05-16 DIAGNOSIS — Z1159 Encounter for screening for other viral diseases: Secondary | ICD-10-CM | POA: Diagnosis not present

## 2020-05-19 DIAGNOSIS — Z20828 Contact with and (suspected) exposure to other viral communicable diseases: Secondary | ICD-10-CM | POA: Diagnosis not present

## 2020-05-19 DIAGNOSIS — Z1159 Encounter for screening for other viral diseases: Secondary | ICD-10-CM | POA: Diagnosis not present

## 2020-05-23 DIAGNOSIS — Z20828 Contact with and (suspected) exposure to other viral communicable diseases: Secondary | ICD-10-CM | POA: Diagnosis not present

## 2020-05-23 DIAGNOSIS — Z1159 Encounter for screening for other viral diseases: Secondary | ICD-10-CM | POA: Diagnosis not present

## 2020-05-26 DIAGNOSIS — Z20828 Contact with and (suspected) exposure to other viral communicable diseases: Secondary | ICD-10-CM | POA: Diagnosis not present

## 2020-05-26 DIAGNOSIS — Z1159 Encounter for screening for other viral diseases: Secondary | ICD-10-CM | POA: Diagnosis not present

## 2020-05-30 DIAGNOSIS — Z20828 Contact with and (suspected) exposure to other viral communicable diseases: Secondary | ICD-10-CM | POA: Diagnosis not present

## 2020-05-30 DIAGNOSIS — Z1159 Encounter for screening for other viral diseases: Secondary | ICD-10-CM | POA: Diagnosis not present

## 2020-06-02 DIAGNOSIS — Z1159 Encounter for screening for other viral diseases: Secondary | ICD-10-CM | POA: Diagnosis not present

## 2020-06-02 DIAGNOSIS — Z20828 Contact with and (suspected) exposure to other viral communicable diseases: Secondary | ICD-10-CM | POA: Diagnosis not present

## 2020-06-06 DIAGNOSIS — Z20828 Contact with and (suspected) exposure to other viral communicable diseases: Secondary | ICD-10-CM | POA: Diagnosis not present

## 2020-06-06 DIAGNOSIS — Z1159 Encounter for screening for other viral diseases: Secondary | ICD-10-CM | POA: Diagnosis not present

## 2020-06-09 DIAGNOSIS — Z20828 Contact with and (suspected) exposure to other viral communicable diseases: Secondary | ICD-10-CM | POA: Diagnosis not present

## 2020-06-09 DIAGNOSIS — Z1159 Encounter for screening for other viral diseases: Secondary | ICD-10-CM | POA: Diagnosis not present

## 2020-06-13 DIAGNOSIS — Z1159 Encounter for screening for other viral diseases: Secondary | ICD-10-CM | POA: Diagnosis not present

## 2020-06-13 DIAGNOSIS — Z20828 Contact with and (suspected) exposure to other viral communicable diseases: Secondary | ICD-10-CM | POA: Diagnosis not present

## 2020-06-16 DIAGNOSIS — Z20828 Contact with and (suspected) exposure to other viral communicable diseases: Secondary | ICD-10-CM | POA: Diagnosis not present

## 2020-06-16 DIAGNOSIS — Z1159 Encounter for screening for other viral diseases: Secondary | ICD-10-CM | POA: Diagnosis not present

## 2020-06-20 DIAGNOSIS — Z20828 Contact with and (suspected) exposure to other viral communicable diseases: Secondary | ICD-10-CM | POA: Diagnosis not present

## 2020-06-20 DIAGNOSIS — Z1159 Encounter for screening for other viral diseases: Secondary | ICD-10-CM | POA: Diagnosis not present

## 2020-06-23 DIAGNOSIS — Z20828 Contact with and (suspected) exposure to other viral communicable diseases: Secondary | ICD-10-CM | POA: Diagnosis not present

## 2020-06-23 DIAGNOSIS — Z1159 Encounter for screening for other viral diseases: Secondary | ICD-10-CM | POA: Diagnosis not present

## 2020-06-30 DIAGNOSIS — Z1159 Encounter for screening for other viral diseases: Secondary | ICD-10-CM | POA: Diagnosis not present

## 2020-06-30 DIAGNOSIS — Z20828 Contact with and (suspected) exposure to other viral communicable diseases: Secondary | ICD-10-CM | POA: Diagnosis not present

## 2020-07-04 DIAGNOSIS — Z1159 Encounter for screening for other viral diseases: Secondary | ICD-10-CM | POA: Diagnosis not present

## 2020-07-04 DIAGNOSIS — Z20828 Contact with and (suspected) exposure to other viral communicable diseases: Secondary | ICD-10-CM | POA: Diagnosis not present

## 2020-07-07 DIAGNOSIS — Z1159 Encounter for screening for other viral diseases: Secondary | ICD-10-CM | POA: Diagnosis not present

## 2020-07-07 DIAGNOSIS — Z20828 Contact with and (suspected) exposure to other viral communicable diseases: Secondary | ICD-10-CM | POA: Diagnosis not present

## 2020-07-11 DIAGNOSIS — Z20828 Contact with and (suspected) exposure to other viral communicable diseases: Secondary | ICD-10-CM | POA: Diagnosis not present

## 2020-07-11 DIAGNOSIS — Z1159 Encounter for screening for other viral diseases: Secondary | ICD-10-CM | POA: Diagnosis not present

## 2020-07-14 DIAGNOSIS — Z1159 Encounter for screening for other viral diseases: Secondary | ICD-10-CM | POA: Diagnosis not present

## 2020-07-14 DIAGNOSIS — Z20828 Contact with and (suspected) exposure to other viral communicable diseases: Secondary | ICD-10-CM | POA: Diagnosis not present

## 2020-07-18 DIAGNOSIS — Z1159 Encounter for screening for other viral diseases: Secondary | ICD-10-CM | POA: Diagnosis not present

## 2020-07-18 DIAGNOSIS — Z20828 Contact with and (suspected) exposure to other viral communicable diseases: Secondary | ICD-10-CM | POA: Diagnosis not present

## 2020-07-21 DIAGNOSIS — Z20828 Contact with and (suspected) exposure to other viral communicable diseases: Secondary | ICD-10-CM | POA: Diagnosis not present

## 2020-07-21 DIAGNOSIS — Z1159 Encounter for screening for other viral diseases: Secondary | ICD-10-CM | POA: Diagnosis not present

## 2020-08-01 DIAGNOSIS — Z1159 Encounter for screening for other viral diseases: Secondary | ICD-10-CM | POA: Diagnosis not present

## 2020-08-01 DIAGNOSIS — Z20828 Contact with and (suspected) exposure to other viral communicable diseases: Secondary | ICD-10-CM | POA: Diagnosis not present

## 2020-08-04 DIAGNOSIS — Z1159 Encounter for screening for other viral diseases: Secondary | ICD-10-CM | POA: Diagnosis not present

## 2020-08-04 DIAGNOSIS — Z20828 Contact with and (suspected) exposure to other viral communicable diseases: Secondary | ICD-10-CM | POA: Diagnosis not present

## 2020-08-11 DIAGNOSIS — Z1159 Encounter for screening for other viral diseases: Secondary | ICD-10-CM | POA: Diagnosis not present

## 2020-08-11 DIAGNOSIS — Z20828 Contact with and (suspected) exposure to other viral communicable diseases: Secondary | ICD-10-CM | POA: Diagnosis not present

## 2020-08-15 DIAGNOSIS — Z20828 Contact with and (suspected) exposure to other viral communicable diseases: Secondary | ICD-10-CM | POA: Diagnosis not present

## 2020-08-15 DIAGNOSIS — Z1159 Encounter for screening for other viral diseases: Secondary | ICD-10-CM | POA: Diagnosis not present

## 2020-08-18 DIAGNOSIS — Z1159 Encounter for screening for other viral diseases: Secondary | ICD-10-CM | POA: Diagnosis not present

## 2020-08-18 DIAGNOSIS — Z20828 Contact with and (suspected) exposure to other viral communicable diseases: Secondary | ICD-10-CM | POA: Diagnosis not present

## 2020-08-22 DIAGNOSIS — Z20828 Contact with and (suspected) exposure to other viral communicable diseases: Secondary | ICD-10-CM | POA: Diagnosis not present

## 2020-08-22 DIAGNOSIS — Z1159 Encounter for screening for other viral diseases: Secondary | ICD-10-CM | POA: Diagnosis not present

## 2020-08-25 DIAGNOSIS — Z20828 Contact with and (suspected) exposure to other viral communicable diseases: Secondary | ICD-10-CM | POA: Diagnosis not present

## 2020-08-25 DIAGNOSIS — Z1159 Encounter for screening for other viral diseases: Secondary | ICD-10-CM | POA: Diagnosis not present

## 2020-08-29 DIAGNOSIS — Z20828 Contact with and (suspected) exposure to other viral communicable diseases: Secondary | ICD-10-CM | POA: Diagnosis not present

## 2020-08-29 DIAGNOSIS — Z1159 Encounter for screening for other viral diseases: Secondary | ICD-10-CM | POA: Diagnosis not present

## 2020-09-01 DIAGNOSIS — Z20828 Contact with and (suspected) exposure to other viral communicable diseases: Secondary | ICD-10-CM | POA: Diagnosis not present

## 2020-09-01 DIAGNOSIS — Z1159 Encounter for screening for other viral diseases: Secondary | ICD-10-CM | POA: Diagnosis not present

## 2020-09-05 DIAGNOSIS — Z1159 Encounter for screening for other viral diseases: Secondary | ICD-10-CM | POA: Diagnosis not present

## 2020-09-05 DIAGNOSIS — Z20828 Contact with and (suspected) exposure to other viral communicable diseases: Secondary | ICD-10-CM | POA: Diagnosis not present

## 2020-09-08 DIAGNOSIS — Z20828 Contact with and (suspected) exposure to other viral communicable diseases: Secondary | ICD-10-CM | POA: Diagnosis not present

## 2020-09-08 DIAGNOSIS — Z1159 Encounter for screening for other viral diseases: Secondary | ICD-10-CM | POA: Diagnosis not present

## 2020-09-11 DIAGNOSIS — Z20828 Contact with and (suspected) exposure to other viral communicable diseases: Secondary | ICD-10-CM | POA: Diagnosis not present

## 2020-09-11 DIAGNOSIS — Z1159 Encounter for screening for other viral diseases: Secondary | ICD-10-CM | POA: Diagnosis not present

## 2020-09-12 DIAGNOSIS — Z20828 Contact with and (suspected) exposure to other viral communicable diseases: Secondary | ICD-10-CM | POA: Diagnosis not present

## 2020-09-12 DIAGNOSIS — Z1159 Encounter for screening for other viral diseases: Secondary | ICD-10-CM | POA: Diagnosis not present

## 2020-09-15 DIAGNOSIS — Z1159 Encounter for screening for other viral diseases: Secondary | ICD-10-CM | POA: Diagnosis not present

## 2020-09-15 DIAGNOSIS — Z20828 Contact with and (suspected) exposure to other viral communicable diseases: Secondary | ICD-10-CM | POA: Diagnosis not present

## 2020-09-19 DIAGNOSIS — Z1159 Encounter for screening for other viral diseases: Secondary | ICD-10-CM | POA: Diagnosis not present

## 2020-09-19 DIAGNOSIS — Z20828 Contact with and (suspected) exposure to other viral communicable diseases: Secondary | ICD-10-CM | POA: Diagnosis not present

## 2020-09-26 DIAGNOSIS — Z1159 Encounter for screening for other viral diseases: Secondary | ICD-10-CM | POA: Diagnosis not present

## 2020-09-26 DIAGNOSIS — Z20828 Contact with and (suspected) exposure to other viral communicable diseases: Secondary | ICD-10-CM | POA: Diagnosis not present

## 2020-09-29 DIAGNOSIS — Z1159 Encounter for screening for other viral diseases: Secondary | ICD-10-CM | POA: Diagnosis not present

## 2020-09-29 DIAGNOSIS — Z20828 Contact with and (suspected) exposure to other viral communicable diseases: Secondary | ICD-10-CM | POA: Diagnosis not present

## 2020-10-03 DIAGNOSIS — Z1159 Encounter for screening for other viral diseases: Secondary | ICD-10-CM | POA: Diagnosis not present

## 2020-10-03 DIAGNOSIS — Z20828 Contact with and (suspected) exposure to other viral communicable diseases: Secondary | ICD-10-CM | POA: Diagnosis not present

## 2020-10-05 DIAGNOSIS — R0989 Other specified symptoms and signs involving the circulatory and respiratory systems: Secondary | ICD-10-CM | POA: Diagnosis not present

## 2020-10-05 DIAGNOSIS — R079 Chest pain, unspecified: Secondary | ICD-10-CM | POA: Diagnosis not present

## 2020-10-06 DIAGNOSIS — Z1159 Encounter for screening for other viral diseases: Secondary | ICD-10-CM | POA: Diagnosis not present

## 2020-10-06 DIAGNOSIS — Z20828 Contact with and (suspected) exposure to other viral communicable diseases: Secondary | ICD-10-CM | POA: Diagnosis not present

## 2020-10-10 DIAGNOSIS — I10 Essential (primary) hypertension: Secondary | ICD-10-CM | POA: Diagnosis not present

## 2020-10-10 DIAGNOSIS — N184 Chronic kidney disease, stage 4 (severe): Secondary | ICD-10-CM | POA: Diagnosis not present

## 2020-10-10 DIAGNOSIS — G309 Alzheimer's disease, unspecified: Secondary | ICD-10-CM | POA: Diagnosis not present

## 2020-10-10 DIAGNOSIS — F0281 Dementia in other diseases classified elsewhere with behavioral disturbance: Secondary | ICD-10-CM | POA: Diagnosis not present

## 2020-10-10 DIAGNOSIS — Z20828 Contact with and (suspected) exposure to other viral communicable diseases: Secondary | ICD-10-CM | POA: Diagnosis not present

## 2020-10-10 DIAGNOSIS — Z1159 Encounter for screening for other viral diseases: Secondary | ICD-10-CM | POA: Diagnosis not present

## 2020-10-13 DIAGNOSIS — Z20828 Contact with and (suspected) exposure to other viral communicable diseases: Secondary | ICD-10-CM | POA: Diagnosis not present

## 2020-10-13 DIAGNOSIS — Z1159 Encounter for screening for other viral diseases: Secondary | ICD-10-CM | POA: Diagnosis not present

## 2020-10-17 ENCOUNTER — Other Ambulatory Visit: Payer: Self-pay

## 2020-10-17 ENCOUNTER — Non-Acute Institutional Stay: Payer: Self-pay | Admitting: Hospice

## 2020-10-17 DIAGNOSIS — Z515 Encounter for palliative care: Secondary | ICD-10-CM

## 2020-10-17 DIAGNOSIS — F0281 Dementia in other diseases classified elsewhere with behavioral disturbance: Secondary | ICD-10-CM | POA: Diagnosis not present

## 2020-10-17 DIAGNOSIS — Z20828 Contact with and (suspected) exposure to other viral communicable diseases: Secondary | ICD-10-CM | POA: Diagnosis not present

## 2020-10-17 DIAGNOSIS — I10 Essential (primary) hypertension: Secondary | ICD-10-CM | POA: Diagnosis not present

## 2020-10-17 DIAGNOSIS — G309 Alzheimer's disease, unspecified: Secondary | ICD-10-CM | POA: Diagnosis not present

## 2020-10-17 DIAGNOSIS — R634 Abnormal weight loss: Secondary | ICD-10-CM

## 2020-10-17 DIAGNOSIS — Z1159 Encounter for screening for other viral diseases: Secondary | ICD-10-CM | POA: Diagnosis not present

## 2020-10-17 DIAGNOSIS — F0391 Unspecified dementia with behavioral disturbance: Secondary | ICD-10-CM

## 2020-10-17 NOTE — Progress Notes (Signed)
PATIENT NAME: Megan Gallegos [place] [id number] [phone] (home)  DOB: July 16, 1920 MRN: [id number]  PRIMARY CARE PROVIDER:   [facility] Marvis Moeller NP             REFERRING PROVIDER:  Hardin NP  RESPONSIBLE PARTY:   Miguel Rota - daughter [phone] CHIEF COMPLAIN: Initial palliative care visit/weight loss  Visit is to build trust and highlight Palliative Medicine as specialized medical care for people living with serious illness, aimed at facilitating better quality of life through symptoms relief, assisting with advance care plan and establishing goals of care. NP called Diane and left her a voicemail with call back number. Visit consisted of counseling and education dealing with the complex and emotionally intense issues of symptom management and palliative care in the setting of serious and potentially life-threatening illness. Palliative care team will continue to support patient, patient's family, and medical team.  RECOMMENDATIONS/PLAN:   Advance Care Planning/CODE STATUS: CODE STATUS reviewed today.  Patient is a DO NOT RESUSCITATE.  Signed DNR in facility chart; same document uploaded to epic today.   GOALS OF CARE: Goals of care include to maximize quality of life and symptom management.  I spent 46 minutes providing this initial consultation. More than 50% of the time in this consultation was spent on counseling patient and coordinating communication. -------------------------------------------------------------------------------------------------------------------------------------- Symptom Management/Plan:  Weight loss: Continue Chocolate boost daily, ice cream.  Provide cues for self feeding; aspiration precautions.  Continue monthly weight check.  If weight loss persists, initiate Mirtazapine 12.5mg  daily. ST consult as needed.  Palliative will continue to monitor for symptom management/decline and make recommendations as  needed. Return 6 weeks or prn. Encouraged to call provider sooner with any concerns.   PPS: 40%   HISTORY OF PRESENT ILLNESS:  AKEILA LANA is a 87 y.o. female with multiple medical problems including weight loss has been chronic,  incremental, likely related to dementia.  Patient lost 8 Ibs in the past 3 months with current weight at 94.4 pounds. No report of dysphagia or pocketing of food. History of Alzheimer's Dementia, HTN, protein caloric malnutrition.  History obtained from review of EMR, discussion with primary nursing staff patient. Review and summarization of old Epic records shows history from other than patient. Rest of 10 point ROS asked and negative. Palliative Care was asked to follow this patient by consultation request of Marvis Moeller NP to help address complex decision making in the context of goals of care.   HOSPICE ELIGIBILITY/DIAGNOSIS: TBD  PAST MEDICAL HISTORY:  Past Medical History:  Diagnosis Date  . Cancer (HCC)    breast  . Hypertension   . Kidney disease   . Thyroid disease      SOCIAL HX: Patient in memory care unit for ongoing care. Social History   Tobacco Use  . Smoking status: Never Smoker  . Smokeless tobacco: Never Used  Substance Use Topics  . Alcohol use: No    FAMILY HX: No family history on file.  Review of lab tests/diagnostics   No results for input(s): WBC, HGB, HCT, PLT, MCV in the last 168 hours. No results for input(s): NA, K, CL, CO2, BUN, CREATININE, GLUCOSE in the last 168 hours. Latest GFR by Cockcroft Gault (not valid in AKI or ESRD) CrCl cannot be calculated (Patient's most recent lab result is older than the maximum 21 days allowed.). No results for input(s): AST, ALT, ALKPHOS, GGT in the last 168 hours.  Invalid input(s): TBILI, CONJBILI, ALB, TOTALPROTEIN No components found for: ALB No results for input(s): APTT, INR in the last 168 hours.  Invalid input(s): PTPATIENT No results for input(s): BNP, PROBNP in the  last 168 hours.  ALLERGIES:  Allergies  Allergen Reactions  . Ceclor [Cefaclor]       PERTINENT MEDICATIONS:  Outpatient Encounter Medications as of 10/17/2020  Medication Sig  . amLODipine (NORVASC) 10 MG tablet Take 10 mg by mouth daily.  Marland Kitchen amLODipine (NORVASC) 5 MG tablet Take 5 mg by mouth daily.  Marland Kitchen aspirin EC 81 MG tablet Take 81 mg by mouth every evening.  Marland Kitchen b complex vitamins capsule Take 1 capsule by mouth every evening.  . calcitRIOL (ROCALTROL) 0.25 MCG capsule   . Calcium Carbonate (CALTRATE 600 PO) Take 1 tablet by mouth daily.  Marland Kitchen co-enzyme Q-10 30 MG capsule Take 30 mg by mouth daily with lunch.  . doxercalciferol (HECTOROL) 0.5 MCG capsule Take 0.5 mcg by mouth daily.  . fish oil-omega-3 fatty acids 1000 MG capsule Take 1 g by mouth 2 (two) times daily.  . furosemide (LASIX) 40 MG tablet Take 40 mg by mouth daily.  Marland Kitchen losartan-hydrochlorothiazide (HYZAAR) 100-12.5 MG tablet Take by mouth.  . Multiple Vitamins-Minerals (OCUVITE PRESERVISION PO) Take 1 tablet by mouth daily.  Marland Kitchen telmisartan (MICARDIS) 80 MG tablet Take 80 mg by mouth daily.  Marland Kitchen triamcinolone cream (KENALOG) 0.1 % Apply 1 application topically 2 (two) times daily.   No facility-administered encounter medications on file as of 10/17/2020.    ROS  General: NAD Constitution: Denies fever/chills EYES: denies vision changes ENMT: denies Xerostomia Cardiovascular: denies chest pain Pulmonary: denies  cough, denies dyspnea  Abdomen: endorses fair to good appetite, denies constipation or diarrhea GU: denies dysuria MSK:  endorses ROM limitations, no falls reported; no joint pain Skin: denies rashes/bruising Neurological: endorses weakness, denies pain, denies insomnia Psych: Endorses positive mood Heme/lymph/immuno: denies bruises, no abnormal bleeding   PHYSICAL EXAM  Height:   5 feet 2 inches Weight:94.4 Ib BMI 17.19 Constitutional: In no acute distress Cardiovascular: regular rate and  rhythm Pulmonary: no cough, no increased work of breathing, normal respiratory effort Abdomen: soft, non tender, positive bowel sounds in all quadrants GU:  no suprapubic tenderness Eyes: Normal lids, no discharge, sclera anicteric ENMT: Moist mucous membranes Musculoskeletal:  no edema in BLE, moderate sarcopenia Skin: no rash to visible skin, dry skin,warm without cyanosis Psych: non-anxious affect Neurological: Weakness but otherwise non focal, forgetful Heme/lymph/immuno: no bruises, no bleeding  Thank you for the opportunity to participate in the care of ATHENE SCHUHMACHER Please call our office at [phone] if we can be of additional assistance.  Note: Portions of this note were generated with Lobbyist. Dictation errors may occur despite best attempts at proofreading.  Teodoro Spray, NP

## 2020-10-20 DIAGNOSIS — Z20828 Contact with and (suspected) exposure to other viral communicable diseases: Secondary | ICD-10-CM | POA: Diagnosis not present

## 2020-10-20 DIAGNOSIS — Z1159 Encounter for screening for other viral diseases: Secondary | ICD-10-CM | POA: Diagnosis not present

## 2020-10-24 DIAGNOSIS — Z1159 Encounter for screening for other viral diseases: Secondary | ICD-10-CM | POA: Diagnosis not present

## 2020-10-24 DIAGNOSIS — Z20828 Contact with and (suspected) exposure to other viral communicable diseases: Secondary | ICD-10-CM | POA: Diagnosis not present

## 2020-10-27 DIAGNOSIS — Z1159 Encounter for screening for other viral diseases: Secondary | ICD-10-CM | POA: Diagnosis not present

## 2020-10-27 DIAGNOSIS — Z20828 Contact with and (suspected) exposure to other viral communicable diseases: Secondary | ICD-10-CM | POA: Diagnosis not present

## 2020-10-31 DIAGNOSIS — Z20828 Contact with and (suspected) exposure to other viral communicable diseases: Secondary | ICD-10-CM | POA: Diagnosis not present

## 2020-10-31 DIAGNOSIS — Z1159 Encounter for screening for other viral diseases: Secondary | ICD-10-CM | POA: Diagnosis not present

## 2020-11-03 DIAGNOSIS — Z1159 Encounter for screening for other viral diseases: Secondary | ICD-10-CM | POA: Diagnosis not present

## 2020-11-03 DIAGNOSIS — Z20828 Contact with and (suspected) exposure to other viral communicable diseases: Secondary | ICD-10-CM | POA: Diagnosis not present

## 2020-11-07 DIAGNOSIS — Z20828 Contact with and (suspected) exposure to other viral communicable diseases: Secondary | ICD-10-CM | POA: Diagnosis not present

## 2020-11-07 DIAGNOSIS — Z1159 Encounter for screening for other viral diseases: Secondary | ICD-10-CM | POA: Diagnosis not present

## 2020-11-14 DIAGNOSIS — Z1159 Encounter for screening for other viral diseases: Secondary | ICD-10-CM | POA: Diagnosis not present

## 2020-11-14 DIAGNOSIS — Z20828 Contact with and (suspected) exposure to other viral communicable diseases: Secondary | ICD-10-CM | POA: Diagnosis not present

## 2020-11-17 DIAGNOSIS — Z1159 Encounter for screening for other viral diseases: Secondary | ICD-10-CM | POA: Diagnosis not present

## 2020-11-17 DIAGNOSIS — Z20828 Contact with and (suspected) exposure to other viral communicable diseases: Secondary | ICD-10-CM | POA: Diagnosis not present

## 2020-11-21 DIAGNOSIS — Z20828 Contact with and (suspected) exposure to other viral communicable diseases: Secondary | ICD-10-CM | POA: Diagnosis not present

## 2020-11-21 DIAGNOSIS — Z1159 Encounter for screening for other viral diseases: Secondary | ICD-10-CM | POA: Diagnosis not present

## 2020-11-24 ENCOUNTER — Non-Acute Institutional Stay: Payer: Self-pay | Admitting: Hospice

## 2020-11-24 ENCOUNTER — Other Ambulatory Visit: Payer: Self-pay

## 2020-11-24 DIAGNOSIS — F0391 Unspecified dementia with behavioral disturbance: Secondary | ICD-10-CM

## 2020-11-24 DIAGNOSIS — R634 Abnormal weight loss: Secondary | ICD-10-CM | POA: Diagnosis not present

## 2020-11-24 DIAGNOSIS — Z515 Encounter for palliative care: Secondary | ICD-10-CM

## 2020-11-24 NOTE — Progress Notes (Signed)
[facility] Consult Note Telephone: [phone]  Fax: [phone]  PATIENT NAME: Megan Gallegos DOB: 1920/04/27 MRN: [id number]   PRIMARY CARE PROVIDER:   [facility] Marvis Moeller NP             REFERRING PROVIDER:  Swartz NP  RESPONSIBLE PARTY:   Miguel Rota - daughter [phone]    Visit is to build trust and highlight Palliative Medicine as specialized medical care for people living with serious illness, aimed at facilitating better quality of life through symptoms relief, assisting with advance care plan and establishing goals of care.   CHIEF COMPLAINT: Palliative follow-up visit/weight loss  RECOMMENDATIONS/PLAN:   1. Advance Care Planning/Code Status: CODE STATUS reviewed.  Patient is a DO NOT RESUSCITATE  2. Goals of Care: Goals of care include to maximize quality of life and symptom management.  Visit consisted of counseling and education dealing with the complex and emotionally intense issues of symptom management and palliative care in the setting of serious and potentially life-threatening illness. Palliative care team will continue to support patient, patient's family, and medical team.  I spent 16  minutes providing this consultation. More than 50% of the time in this consultation was spent on coordinating communication.  -------------------------------------------------------------------------------------------------------------------------------------------------- 3. Symptom management/Plan:  Weight loss: Weight improved in the last month from 94.4 pounds to currently 99.2 pounds.  Continue chocolate boost daily, Ensure, ice cream.  Provided cues for self feedingAnd provide assistance as needed to ensure adequate oral intake.  Continue monthly weight check.  ST consult as needed. Patient is a high fall risk; fall precautions discussed.  Palliative will continue to monitor for symptom  management/decline and make recommendations as needed. Return 2 months or prn. Encouraged to call provider sooner with any concerns.   HISTORY OF PRESENT ILLNESS: Follow-up visit for Megan Gallegos, a 87 y.o. female with multiple medical problems including weight loss which has been chronic,  incremental, likely related to dementia, poor caloric intake and poor appetite; there is negatively impact patient's physiological reserve and overall function.  Previous weight  was 94.4 pounds.   Ensure and boost have been helpful as patient current weight is now 99.2 pounds no report of dysphagia or pocketing of food. History of Alzheimer's Dementia, HTN, protein caloric malnutrition.  History obtained from review of EMR, discussion with primary nursing staff patient.History obtained from review of EMR, discussion with patient/family.   Review and summarization of Epic records shows history from other than patient. Rest of 10 point ROS asked and negative.  Palliative Care was asked to follow this patient by consultation request of No ref. provider found to help address complex decision making in the context of advance care planning and goals of care clarification.   CODE STATUS: DNR  PPS: 40%  HOSPICE ELIGIBILITY/DIAGNOSIS: TBD  PAST MEDICAL HISTORY:  Past Medical History:  Diagnosis Date  . Cancer (HCC)    breast  . Hypertension   . Kidney disease   . Thyroid disease     SOCIAL HX: @SOCX  Patient at [facility] for ongoing care   FAMILY HX: No family history on file.  Review lab tests/diagnostics No results for input(s): WBC, HGB, HCT, PLT, MCV in the last 168 hours. No results for input(s): NA, K, CL, CO2, BUN, CREATININE, GLUCOSE in the last 168 hours. Latest GFR by Cockcroft Gault (not valid in AKI or ESRD) CrCl cannot be calculated (Patient's most recent lab result is older than  the maximum 21 days allowed.). No results for input(s): AST, ALT, ALKPHOS, GGT in the last 168 hours.  Invalid  input(s): TBILI, CONJBILI, ALB, TOTALPROTEIN No components found for: ALB No results for input(s): APTT, INR in the last 168 hours.  Invalid input(s): PTPATIENT No results for input(s): BNP, PROBNP in the last 168 hours.  ALLERGIES:  Allergies  Allergen Reactions  . Ceclor [Cefaclor]       PERTINENT MEDICATIONS:  Outpatient Encounter Medications as of 11/24/2020  Medication Sig  . amLODipine (NORVASC) 10 MG tablet Take 10 mg by mouth daily.  Marland Kitchen amLODipine (NORVASC) 5 MG tablet Take 5 mg by mouth daily.  Marland Kitchen aspirin EC 81 MG tablet Take 81 mg by mouth every evening.  Marland Kitchen b complex vitamins capsule Take 1 capsule by mouth every evening.  . calcitRIOL (ROCALTROL) 0.25 MCG capsule   . Calcium Carbonate (CALTRATE 600 PO) Take 1 tablet by mouth daily.  Marland Kitchen co-enzyme Q-10 30 MG capsule Take 30 mg by mouth daily with lunch.  . doxercalciferol (HECTOROL) 0.5 MCG capsule Take 0.5 mcg by mouth daily.  . fish oil-omega-3 fatty acids 1000 MG capsule Take 1 g by mouth 2 (two) times daily.  . furosemide (LASIX) 40 MG tablet Take 40 mg by mouth daily.  Marland Kitchen losartan-hydrochlorothiazide (HYZAAR) 100-12.5 MG tablet Take by mouth.  . Multiple Vitamins-Minerals (OCUVITE PRESERVISION PO) Take 1 tablet by mouth daily.  Marland Kitchen telmisartan (MICARDIS) 80 MG tablet Take 80 mg by mouth daily.  Marland Kitchen triamcinolone cream (KENALOG) 0.1 % Apply 1 application topically 2 (two) times daily.   No facility-administered encounter medications on file as of 11/24/2020.    ROS  General: NAD Constitution: Denies fever/chills EYES: denies vision changes ENMT: denies Xerostomia Cardiovascular: denies chest pain Pulmonary: denies cough, denies dyspnea  Abdomen: endorses fair to good appetite, denies constipation or diarrhea GU: denies dysuria MSK: endorses ROM limitations, no falls reported; no joint pain Skin: denies rashes/bruising Neurological: endorses weakness, denies pain, denies insomnia Psych: Endorses positive  mood Heme/lymph/immuno: denies bruises, no abnormal bleeding   PHYSICAL EXAM  Height:   5 feet 2 inches Weight:99.2 Constitutional: In no acute distress Cardiovascular: regular rate and rhythm Pulmonary: no cough, no increased work of breathing, normal respiratory effort Abdomen: soft, non tender, positive bowel sounds in all quadrants GU:  no suprapubic tenderness Eyes: Normal lids, no discharge, sclera anicteric ENMT: Moist mucous membranes Musculoskeletal:  no edema in BLE, moderate sarcopenia Skin: no rash to visible skin, dry skin,warm without cyanosis Psych: non-anxious affect Neurological: Weakness but otherwise non focal, forgetful Heme/lymph/immuno: no bruises, no bleeding Thank you for the opportunity to participate in the care of SYLENA LOTTER Please call our office at [phone] if we can be of additional assistance.  Note: Portions of this note were generated with Lobbyist. Dictation errors may occur despite best attempts at proofreading.  Teodoro Spray, NP

## 2020-11-28 DIAGNOSIS — F0281 Dementia in other diseases classified elsewhere with behavioral disturbance: Secondary | ICD-10-CM | POA: Diagnosis not present

## 2020-11-28 DIAGNOSIS — K5901 Slow transit constipation: Secondary | ICD-10-CM | POA: Diagnosis not present

## 2020-11-28 DIAGNOSIS — G309 Alzheimer's disease, unspecified: Secondary | ICD-10-CM | POA: Diagnosis not present

## 2020-11-28 DIAGNOSIS — Z20828 Contact with and (suspected) exposure to other viral communicable diseases: Secondary | ICD-10-CM | POA: Diagnosis not present

## 2020-11-28 DIAGNOSIS — I10 Essential (primary) hypertension: Secondary | ICD-10-CM | POA: Diagnosis not present

## 2020-11-28 DIAGNOSIS — Z1159 Encounter for screening for other viral diseases: Secondary | ICD-10-CM | POA: Diagnosis not present

## 2020-12-01 DIAGNOSIS — Z20828 Contact with and (suspected) exposure to other viral communicable diseases: Secondary | ICD-10-CM | POA: Diagnosis not present

## 2020-12-01 DIAGNOSIS — Z1159 Encounter for screening for other viral diseases: Secondary | ICD-10-CM | POA: Diagnosis not present

## 2020-12-05 DIAGNOSIS — Z1159 Encounter for screening for other viral diseases: Secondary | ICD-10-CM | POA: Diagnosis not present

## 2020-12-05 DIAGNOSIS — Z20828 Contact with and (suspected) exposure to other viral communicable diseases: Secondary | ICD-10-CM | POA: Diagnosis not present

## 2020-12-08 DIAGNOSIS — Z20828 Contact with and (suspected) exposure to other viral communicable diseases: Secondary | ICD-10-CM | POA: Diagnosis not present

## 2020-12-08 DIAGNOSIS — Z1159 Encounter for screening for other viral diseases: Secondary | ICD-10-CM | POA: Diagnosis not present

## 2020-12-12 DIAGNOSIS — Z20828 Contact with and (suspected) exposure to other viral communicable diseases: Secondary | ICD-10-CM | POA: Diagnosis not present

## 2020-12-12 DIAGNOSIS — Z1159 Encounter for screening for other viral diseases: Secondary | ICD-10-CM | POA: Diagnosis not present

## 2020-12-15 DIAGNOSIS — Z20828 Contact with and (suspected) exposure to other viral communicable diseases: Secondary | ICD-10-CM | POA: Diagnosis not present

## 2020-12-15 DIAGNOSIS — Z1159 Encounter for screening for other viral diseases: Secondary | ICD-10-CM | POA: Diagnosis not present

## 2020-12-19 DIAGNOSIS — Z20828 Contact with and (suspected) exposure to other viral communicable diseases: Secondary | ICD-10-CM | POA: Diagnosis not present

## 2020-12-19 DIAGNOSIS — Z1159 Encounter for screening for other viral diseases: Secondary | ICD-10-CM | POA: Diagnosis not present

## 2020-12-22 DIAGNOSIS — Z1159 Encounter for screening for other viral diseases: Secondary | ICD-10-CM | POA: Diagnosis not present

## 2020-12-22 DIAGNOSIS — Z20828 Contact with and (suspected) exposure to other viral communicable diseases: Secondary | ICD-10-CM | POA: Diagnosis not present

## 2020-12-26 DIAGNOSIS — F0281 Dementia in other diseases classified elsewhere with behavioral disturbance: Secondary | ICD-10-CM | POA: Diagnosis not present

## 2020-12-26 DIAGNOSIS — Z20828 Contact with and (suspected) exposure to other viral communicable diseases: Secondary | ICD-10-CM | POA: Diagnosis not present

## 2020-12-26 DIAGNOSIS — G309 Alzheimer's disease, unspecified: Secondary | ICD-10-CM | POA: Diagnosis not present

## 2020-12-26 DIAGNOSIS — K5901 Slow transit constipation: Secondary | ICD-10-CM | POA: Diagnosis not present

## 2020-12-26 DIAGNOSIS — Z1159 Encounter for screening for other viral diseases: Secondary | ICD-10-CM | POA: Diagnosis not present

## 2020-12-26 DIAGNOSIS — I10 Essential (primary) hypertension: Secondary | ICD-10-CM | POA: Diagnosis not present

## 2020-12-29 DIAGNOSIS — Z20828 Contact with and (suspected) exposure to other viral communicable diseases: Secondary | ICD-10-CM | POA: Diagnosis not present

## 2020-12-29 DIAGNOSIS — Z1159 Encounter for screening for other viral diseases: Secondary | ICD-10-CM | POA: Diagnosis not present

## 2021-01-02 DIAGNOSIS — Z20828 Contact with and (suspected) exposure to other viral communicable diseases: Secondary | ICD-10-CM | POA: Diagnosis not present

## 2021-01-02 DIAGNOSIS — Z1159 Encounter for screening for other viral diseases: Secondary | ICD-10-CM | POA: Diagnosis not present

## 2021-01-05 DIAGNOSIS — Z20828 Contact with and (suspected) exposure to other viral communicable diseases: Secondary | ICD-10-CM | POA: Diagnosis not present

## 2021-01-05 DIAGNOSIS — Z1159 Encounter for screening for other viral diseases: Secondary | ICD-10-CM | POA: Diagnosis not present

## 2021-01-09 DIAGNOSIS — Z20828 Contact with and (suspected) exposure to other viral communicable diseases: Secondary | ICD-10-CM | POA: Diagnosis not present

## 2021-01-09 DIAGNOSIS — Z1159 Encounter for screening for other viral diseases: Secondary | ICD-10-CM | POA: Diagnosis not present

## 2021-01-12 DIAGNOSIS — Z1159 Encounter for screening for other viral diseases: Secondary | ICD-10-CM | POA: Diagnosis not present

## 2021-01-12 DIAGNOSIS — Z20828 Contact with and (suspected) exposure to other viral communicable diseases: Secondary | ICD-10-CM | POA: Diagnosis not present

## 2021-01-13 ENCOUNTER — Non-Acute Institutional Stay: Payer: Medicare Other | Admitting: Hospice

## 2021-01-13 ENCOUNTER — Other Ambulatory Visit: Payer: Self-pay

## 2021-01-13 DIAGNOSIS — R634 Abnormal weight loss: Secondary | ICD-10-CM | POA: Diagnosis not present

## 2021-01-13 DIAGNOSIS — F0391 Unspecified dementia with behavioral disturbance: Secondary | ICD-10-CM | POA: Diagnosis not present

## 2021-01-13 DIAGNOSIS — Z515 Encounter for palliative care: Secondary | ICD-10-CM

## 2021-01-13 DIAGNOSIS — I10 Essential (primary) hypertension: Secondary | ICD-10-CM

## 2021-01-13 DIAGNOSIS — F03911 Unspecified dementia, unspecified severity, with agitation: Secondary | ICD-10-CM

## 2021-01-13 NOTE — Progress Notes (Signed)
[facility] Consult Note Telephone: [phone]  Fax: [phone]  PATIENT NAME: Megan Gallegos DOB: Jul 28, 1920 MRN: [id number]  PRIMARY CARE PROVIDER:[facility] Marvis Moeller NP REFERRING PROVIDER:[facility] Marvis Moeller NP  RESPONSIBLE PARTY:Diane Joaquim Lai - daughter [phone]    Name Relation Home Work Mobile   [place] Daughter [phone]        Visit is to build trust and highlight Palliative Medicine as specialized medical care for people living with serious illness, aimed at facilitating better quality of life through symptoms relief, assisting with advance care planning and complex medical decision making.   RECOMMENDATIONS/PLAN:   Advance Care Planning/Code Status: Patient is a DNR  Goals of Care: Goals of care include to maximize quality of life and symptom management.  Visit consisted of counseling and education dealing with the complex and emotionally intense issues of symptom management and palliative care in the setting of serious and potentially life-threatening illness. Palliative care team will continue to support patient, patient's family, and medical team.  Symptom management/Plan:  Hypertension: Continue Amlodipine and Losartan at ordered.  Weight loss: Current weight is 99.6 up from 94.42 in Feb 2022. Continue Boost rich chocolate daily as needed for nutritional supplementation. Encouraged  Ongoing supportive care. Agitation: Related to dementia.  Patient is often resistant to care, refused BP check during visit.  Continue ordered alprazolam as needed for agitation/combative behavior Dementia: Donepezil Follow up: Palliative care will continue to follow for complex medical decision making, advance care planning, and clarification of goals. Return 6 weeks or prn. Encouraged to call provider sooner with any concerns.  CHIEF COMPLAINT: Palliative follow up  visit/Hypertension  HISTORY OF PRESENT ILLNESS:  Megan Gallegos a 87 y.o. female with multiple medical problems including Hypertension which is chronic, BP readings in the last two months range in 710G systolic.  Patient denies headaches, dizziness, palpitations.  History of Alzheimer dementia, protein caloric malnutrition, agitation, chronic kidney disease.  History obtained from review of EMR, discussion with primary team, familyand/or patient. Records reviewed and summarized above. All 10 point systems reviewed and are negative except as documented in history of present illness above  Review and summarization of Epic records shows history from other than patient.   Palliative Care was asked to follow this patient by consultation request of No ref. provider found to help address complex decision making in the context of advance care planning and goals of care clarification.   PPS: 40% ROS  General: NAD, appropriately dressed Constitution: Denies fever/chills EYES: denies vision changes ENMT: denies Xerostomia,  dysphagia Cardiovascular: denies chest pain Pulmonary: denies  cough, denies dyspnea  Abdomen: endorses fair appetite, denies constipation or diarrhea GU: denies dysuria MSK:  endorses ROM limitations, no falls reported Skin: denies rashes/bruising Neurological: endorses weakness, denies pain, denies insomnia Psych: Endorses positive mood Heme/lymph/immuno: denies bruises, no abnormal bleeding   PHYSICAL EXAM  Height 5 feet 2 inches weight 99.6, up from 94.42 February 2022 General: In no acute distress, appropriately dressed Cardiovascular: regular rate and rhythm; no edema in BLE Pulmonary: no cough, no increased work of breathing, normal respiratory effort Abdomen: soft, non tender, no guarding, positive bowel sounds in all quadrants GU:  no suprapubic tenderness Eyes: Normal lids, no discharge, sclera anicteric ENMT: Moist mucous membranes Musculoskeletal:  weakness,  moderate sarcopenia Skin: no rash to visible skin, warm without cyanosis,  Psych: non-anxious affect Neurological: Weakness but otherwise non focal Heme/lymph/immuno: no bruises, no bleeding  PERTINENT MEDICATIONS:  Outpatient Encounter Medications as of 01/13/2021  Medication Sig  . amLODipine (NORVASC) 10 MG tablet Take 10 mg by mouth daily.  Marland Kitchen amLODipine (NORVASC) 5 MG tablet Take 5 mg by mouth daily.  Marland Kitchen aspirin EC 81 MG tablet Take 81 mg by mouth every evening.  Marland Kitchen b complex vitamins capsule Take 1 capsule by mouth every evening.  . calcitRIOL (ROCALTROL) 0.25 MCG capsule   . Calcium Carbonate (CALTRATE 600 PO) Take 1 tablet by mouth daily.  Marland Kitchen co-enzyme Q-10 30 MG capsule Take 30 mg by mouth daily with lunch.  . doxercalciferol (HECTOROL) 0.5 MCG capsule Take 0.5 mcg by mouth daily.  . fish oil-omega-3 fatty acids 1000 MG capsule Take 1 g by mouth 2 (two) times daily.  . furosemide (LASIX) 40 MG tablet Take 40 mg by mouth daily.  Marland Kitchen losartan-hydrochlorothiazide (HYZAAR) 100-12.5 MG tablet Take by mouth.  . Multiple Vitamins-Minerals (OCUVITE PRESERVISION PO) Take 1 tablet by mouth daily.  Marland Kitchen telmisartan (MICARDIS) 80 MG tablet Take 80 mg by mouth daily.  Marland Kitchen triamcinolone cream (KENALOG) 0.1 % Apply 1 application topically 2 (two) times daily.   No facility-administered encounter medications on file as of 01/13/2021.    HOSPICE ELIGIBILITY/DIAGNOSIS: TBD  PAST MEDICAL HISTORY:  Past Medical History:  Diagnosis Date  . Cancer (HCC)    breast  . Hypertension   . Kidney disease   . Thyroid disease      SOCIAL HX: @SOCX  Patient lives at   for ongoing care  FAMILY HX: No family history on file.  Review lab tests/diagnostics No results for input(s): WBC, HGB, HCT, PLT, MCV in the last 168 hours. No results for input(s): NA, K, CL, CO2, BUN, CREATININE, GLUCOSE in the last 168 hours. CrCl cannot be calculated (Patient's most recent lab result is older than the maximum 21 days  allowed.).  ALLERGIES:  Allergies  Allergen Reactions  . Ceclor [Cefaclor]       I spent  50 minutes providing this consultation; this includes time spent with patient/family, chart review and documentation. More than 50% of the time in this consultation was spent on counseling and coordinating communication   Thank you for the opportunity to participate in the care of Megan Gallegos Please call our office at [phone] if we can be of additional assistance.  Note: Portions of this note were generated with Lobbyist. Dictation errors may occur despite best attempts at proofreading.  Teodoro Spray, NP

## 2021-01-23 DIAGNOSIS — Z1159 Encounter for screening for other viral diseases: Secondary | ICD-10-CM | POA: Diagnosis not present

## 2021-01-23 DIAGNOSIS — K5901 Slow transit constipation: Secondary | ICD-10-CM | POA: Diagnosis not present

## 2021-01-23 DIAGNOSIS — Z20828 Contact with and (suspected) exposure to other viral communicable diseases: Secondary | ICD-10-CM | POA: Diagnosis not present

## 2021-01-23 DIAGNOSIS — I10 Essential (primary) hypertension: Secondary | ICD-10-CM | POA: Diagnosis not present

## 2021-01-23 DIAGNOSIS — F0390 Unspecified dementia without behavioral disturbance: Secondary | ICD-10-CM | POA: Diagnosis not present

## 2021-01-26 DIAGNOSIS — Z1159 Encounter for screening for other viral diseases: Secondary | ICD-10-CM | POA: Diagnosis not present

## 2021-01-26 DIAGNOSIS — Z20828 Contact with and (suspected) exposure to other viral communicable diseases: Secondary | ICD-10-CM | POA: Diagnosis not present

## 2021-02-02 DIAGNOSIS — Z1159 Encounter for screening for other viral diseases: Secondary | ICD-10-CM | POA: Diagnosis not present

## 2021-02-02 DIAGNOSIS — Z20828 Contact with and (suspected) exposure to other viral communicable diseases: Secondary | ICD-10-CM | POA: Diagnosis not present

## 2021-02-09 DIAGNOSIS — Z20828 Contact with and (suspected) exposure to other viral communicable diseases: Secondary | ICD-10-CM | POA: Diagnosis not present

## 2021-02-09 DIAGNOSIS — Z1159 Encounter for screening for other viral diseases: Secondary | ICD-10-CM | POA: Diagnosis not present

## 2021-02-13 DIAGNOSIS — Z1159 Encounter for screening for other viral diseases: Secondary | ICD-10-CM | POA: Diagnosis not present

## 2021-02-13 DIAGNOSIS — Z20828 Contact with and (suspected) exposure to other viral communicable diseases: Secondary | ICD-10-CM | POA: Diagnosis not present

## 2021-02-14 ENCOUNTER — Other Ambulatory Visit: Payer: Self-pay

## 2021-02-14 ENCOUNTER — Emergency Department (HOSPITAL_COMMUNITY): Payer: Medicare Other

## 2021-02-14 ENCOUNTER — Encounter (HOSPITAL_COMMUNITY): Payer: Self-pay | Admitting: Emergency Medicine

## 2021-02-14 ENCOUNTER — Emergency Department (HOSPITAL_COMMUNITY)
Admission: EM | Admit: 2021-02-14 | Discharge: 2021-02-14 | Disposition: A | Discharge status: Home / Self Care | Payer: Medicare Other | Attending: Emergency Medicine | Admitting: Emergency Medicine

## 2021-02-14 DIAGNOSIS — W19XXXA Unspecified fall, initial encounter: Secondary | ICD-10-CM | POA: Diagnosis not present

## 2021-02-14 DIAGNOSIS — W1830XA Fall on same level, unspecified, initial encounter: Secondary | ICD-10-CM | POA: Diagnosis not present

## 2021-02-14 DIAGNOSIS — I10 Essential (primary) hypertension: Secondary | ICD-10-CM | POA: Insufficient documentation

## 2021-02-14 DIAGNOSIS — M4312 Spondylolisthesis, cervical region: Secondary | ICD-10-CM | POA: Diagnosis not present

## 2021-02-14 DIAGNOSIS — I6529 Occlusion and stenosis of unspecified carotid artery: Secondary | ICD-10-CM | POA: Diagnosis not present

## 2021-02-14 DIAGNOSIS — Z853 Personal history of malignant neoplasm of breast: Secondary | ICD-10-CM | POA: Insufficient documentation

## 2021-02-14 DIAGNOSIS — R000 Tachycardia, unspecified: Secondary | ICD-10-CM | POA: Diagnosis not present

## 2021-02-14 DIAGNOSIS — S7012XA Contusion of left thigh, initial encounter: Secondary | ICD-10-CM | POA: Diagnosis not present

## 2021-02-14 DIAGNOSIS — R410 Disorientation, unspecified: Secondary | ICD-10-CM | POA: Diagnosis not present

## 2021-02-14 DIAGNOSIS — S0003XA Contusion of scalp, initial encounter: Secondary | ICD-10-CM | POA: Diagnosis not present

## 2021-02-14 DIAGNOSIS — Z79899 Other long term (current) drug therapy: Secondary | ICD-10-CM | POA: Diagnosis not present

## 2021-02-14 DIAGNOSIS — F0390 Unspecified dementia without behavioral disturbance: Secondary | ICD-10-CM | POA: Insufficient documentation

## 2021-02-14 DIAGNOSIS — Z7982 Long term (current) use of aspirin: Secondary | ICD-10-CM | POA: Insufficient documentation

## 2021-02-14 DIAGNOSIS — S0512XA Contusion of eyeball and orbital tissues, left eye, initial encounter: Secondary | ICD-10-CM | POA: Diagnosis not present

## 2021-02-14 DIAGNOSIS — R609 Edema, unspecified: Secondary | ICD-10-CM | POA: Diagnosis not present

## 2021-02-14 DIAGNOSIS — S40012A Contusion of left shoulder, initial encounter: Secondary | ICD-10-CM | POA: Diagnosis not present

## 2021-02-14 DIAGNOSIS — S40021A Contusion of right upper arm, initial encounter: Secondary | ICD-10-CM | POA: Insufficient documentation

## 2021-02-14 DIAGNOSIS — R296 Repeated falls: Secondary | ICD-10-CM | POA: Diagnosis not present

## 2021-02-14 DIAGNOSIS — S0592XA Unspecified injury of left eye and orbit, initial encounter: Secondary | ICD-10-CM | POA: Diagnosis present

## 2021-02-14 LAB — URINALYSIS, MICROSCOPIC (REFLEX)
Bacteria, UA: NONE SEEN
RBC / HPF: NONE SEEN RBC/hpf (ref 0–5)

## 2021-02-14 LAB — URINALYSIS, ROUTINE W REFLEX MICROSCOPIC
Bilirubin Urine: NEGATIVE
Glucose, UA: NEGATIVE mg/dL
Ketones, ur: NEGATIVE mg/dL
Nitrite: NEGATIVE
Protein, ur: NEGATIVE mg/dL
Specific Gravity, Urine: 1.01 (ref 1.005–1.030)
pH: 6.5 (ref 5.0–8.0)

## 2021-02-14 NOTE — ED Notes (Signed)
Ptar called pt is number 19 on the list

## 2021-02-14 NOTE — ED Notes (Signed)
Pt transported to CT and Xray. 

## 2021-02-14 NOTE — Discharge Instructions (Addendum)
Your work-up today was overall reassuring.  CTs of your head and neck were negative.  X-rays of your chest, right arm, left shoulder, and pelvis were normal. Your urine did not show signs of infection. Use Tylenol ibuprofen as needed for pain. Return to the emergency room if any new, worsening, concerning symptoms.

## 2021-02-14 NOTE — ED Notes (Signed)
Pt ambulated with walker with standby assist x2. Pt did not seem to be in pain. Pt tolerated well.

## 2021-02-14 NOTE — ED Provider Notes (Signed)
[facility] EMERGENCY DEPARTMENT Provider Note   CSN: [id number] Arrival date & time: 02/14/21  0846     History Chief Complaint  Patient presents with   Lytle Michaels    FANTASHA DANIELE is a 87 y.o. female presenting for evaluation after fall.  Level 5 caveat due to dementia.  Patient unable to provide any history.  Her triage note, patient arrives from a facility where she had an unwitnessed fall, found on the floor at her baseline mental status.  Per facility, patient usually walks with a walker, has a history of dementia.  Patient had a previous fall which resulted in a broken right humerus, this is a baseline deformity.  Per chart review, patient with a history of hypertension, kidney disease, breast cancer status postmastectomy.  Patient arrives with a DNR and MOST form requesting comfort measures as appropriate.   HPI     Past Medical History:  Diagnosis Date   Cancer ([place])    breast   Hypertension    Kidney disease    Thyroid disease     Patient Active Problem List   Diagnosis Date Noted   BURSITIS, RIGHT SHOULDER 11/11/2007    Past Surgical History:  Procedure Laterality Date   MASTECTOMY     right     OB History   No obstetric history on file.     No family history on file.  Social History   Tobacco Use   Smoking status: Never   Smokeless tobacco: Never  Substance Use Topics   Alcohol use: No   Drug use: No    Home Medications Prior to Admission medications   Medication Sig Start Date End Date Taking? Authorizing Provider  amLODipine (NORVASC) 10 MG tablet Take 10 mg by mouth daily.    [provider]  amLODipine (NORVASC) 5 MG tablet Take 5 mg by mouth daily. 08/12/19   [provider]  aspirin EC 81 MG tablet Take 81 mg by mouth every evening.    [provider]  b complex vitamins capsule Take 1 capsule by mouth every evening.    [provider]  calcitRIOL (ROCALTROL) 0.25 MCG capsule   08/12/19   [provider]  Calcium Carbonate (CALTRATE 600 PO) Take 1 tablet by mouth daily.    [provider]  co-enzyme Q-10 30 MG capsule Take 30 mg by mouth daily with lunch.    [provider]  doxercalciferol (HECTOROL) 0.5 MCG capsule Take 0.5 mcg by mouth daily.    [provider]  fish oil-omega-3 fatty acids 1000 MG capsule Take 1 g by mouth 2 (two) times daily.    [provider]  furosemide (LASIX) 40 MG tablet Take 40 mg by mouth daily. 08/12/19   [provider]  losartan-hydrochlorothiazide (HYZAAR) 100-12.5 MG tablet Take by mouth.    [provider]  Multiple Vitamins-Minerals (OCUVITE PRESERVISION PO) Take 1 tablet by mouth daily.    [provider]  telmisartan (MICARDIS) 80 MG tablet Take 80 mg by mouth daily.    [provider]  triamcinolone cream (KENALOG) 0.1 % Apply 1 application topically 2 (two) times daily. 05/01/19   [provider]    Allergies    Ceclor [cefaclor]  Review of Systems   Review of Systems  Unable to perform ROS: Dementia   Physical Exam Updated Vital Signs BP (!) 175/100 (BP Location: Left Arm)   Pulse 75   Temp 97.6 F (36.4 C) (Temporal)  Resp 16   SpO2 100%   Physical Exam Vitals and nursing note reviewed.  Constitutional:      General: She is not in acute distress.    Appearance: She is underweight.  HENT:     Head: Normocephalic.     Comments: Left periorbital contusion with laceration of the left eyelid and left temple. Eyes:     Conjunctiva/sclera: Conjunctivae normal.     Pupils: Pupils are equal, round, and reactive to light.  Neck:     Comments: In c-collar Cardiovascular:     Rate and Rhythm: Normal rate and regular rhythm.     Pulses: Normal pulses.  Pulmonary:     Effort: Pulmonary effort is normal. No respiratory distress.     Breath sounds: Normal breath sounds. No wheezing.  Abdominal:     General: There is no  distension.     Palpations: Abdomen is soft. There is no mass.     Tenderness: There is no abdominal tenderness. There is no guarding or rebound.  Musculoskeletal:     Comments: Baseline deformity of the right humerus noted.  Contusion noted of the right upper arm. Abrasion and contusion of the left shoulder. Patient winces with palpation of the hips, and right leg is shortened and externally rotated.  Skin:    General: Skin is warm and dry.     Capillary Refill: Capillary refill takes less than 2 seconds.  Neurological:     Mental Status: She is alert. Mental status is at baseline.    ED Results / Procedures / Treatments   Labs (all labs ordered are listed, but only abnormal results are displayed) Labs Reviewed  URINALYSIS, ROUTINE W REFLEX MICROSCOPIC - Abnormal; Notable for the following components:      Result Value   Hgb urine dipstick TRACE (*)    Leukocytes,Ua SMALL (*)    All other components within normal limits  URINALYSIS, MICROSCOPIC (REFLEX) - Abnormal; Notable for the following components:   Non Squamous Epithelial PRESENT (*)    All other components within normal limits    EKG EKG Interpretation  Date/Time:  Tuesday February 14 2021 09:59:09 EDT Ventricular Rate:  50 PR Interval:  205 QRS Duration: 95 QT Interval:  506 QTC Calculation: 462 R Axis:   11 Text Interpretation: Sinus rhythm Borderline low voltage, extremity leads Confirmed by Sherwood Gambler [phone]) on 02/14/2021 10:18:11 AM Also confirmed by Sherwood Gambler [phone]), editor Hattie Perch ([id number])  on 02/14/2021 11:39:58 AM  Radiology DG Chest 1 View  Result Date: 02/14/2021 CLINICAL DATA:  87 year old female with confusion.  Falls. EXAM: CHEST  1 VIEW COMPARISON:  Portable chest 08/25/2017 and earlier. FINDINGS: Supine AP view at 1030 hours. Possible chronic right humeral shaft fracture, uncertain. Lung volumes and mediastinal contours are normal. Calcified aortic atherosclerosis. Visualized  tracheal air column is within normal limits. Allowing for portable technique the lungs are clear. Negative visible bowel gas pattern. IMPRESSION: 1.  No acute cardiopulmonary abnormality. 2.  Aortic Atherosclerosis (ICD10-I70.0). Electronically Signed   By: Genevie Ann M.D.   On: 02/14/2021 10:37   CT Head Wo Contrast  Result Date: 02/14/2021 CLINICAL DATA:  87 year old female with confusion. Unwitnessed fall. EXAM: CT HEAD WITHOUT CONTRAST TECHNIQUE: Contiguous axial images were obtained from the base of the skull through the vertex without intravenous contrast. COMPARISON:  Report of head CT 05/26/2002 (no images available). FINDINGS: Brain: No midline shift, ventriculomegaly, mass effect, evidence of mass lesion, intracranial hemorrhage or evidence of cortically based acute  infarction. Moderate, confluent bilateral cerebral white matter hypodensity. Heterogeneity in the left thalamus. No definite cortical encephalomalacia. Vascular: Calcified atherosclerosis at the skull base. No suspicious intracranial vascular hyperdensity. Skull: No fracture identified. Sinuses/Orbits: Bubbly opacity in the right sphenoid sinus with moderate mucosal thickening in the right sphenoid and maxillary sinus. Mild left sphenoid mucosal thickening. Other paranasal sinuses, mastoids and tympanic cavities are well aerated. Other: Left forehead mild scalp hematoma or contusion on series 4, image 31. Underlying left frontal bone intact. No scalp soft tissue gas. Calcified scalp vessel atherosclerosis. Anterior left face subcutaneous contusion on series 4, image 1. Underlying left zygoma appears intact. Postoperative changes to both globes. IMPRESSION: 1. Left forehead and anterior left face soft tissue injury with no underlying skull fracture identified. 2. No acute traumatic injury to the brain identified. Advanced cerebral white matter disease, and evidence of small-vessel disease also in the left thalamus. 3. Mild paranasal sinus  disease. Electronically Signed   By: Genevie Ann M.D.   On: 02/14/2021 10:46   CT Cervical Spine Wo Contrast  Result Date: 02/14/2021 CLINICAL DATA:  87 year old female with confusion. Unwitnessed fall. EXAM: CT CERVICAL SPINE WITHOUT CONTRAST TECHNIQUE: Multidetector CT imaging of the cervical spine was performed without intravenous contrast. Multiplanar CT image reconstructions were also generated. COMPARISON:  Head CT today. FINDINGS: Alignment: Anterolisthesis of C4 on C5 measuring 3 mm. Mild retrolisthesis of C5 on C6. Background straightening of cervical lordosis, mild dextroconvex scoliosis. Cervicothoracic junction alignment is within normal limits. Bilateral posterior element alignment is within normal limits. Skull base and vertebrae: Visualized skull base is intact. No atlanto-occipital dissociation. C1 and C2 appear intact and aligned. No acute osseous abnormality identified. Soft tissues and spinal canal: No prevertebral fluid or swelling. No visible canal hematoma. Mild for age calcified carotid atherosclerosis in the neck. Disc levels: C3-C4 ankylosis, and superimposed ankylosis of the left C4-C5 facets. Possible developing interbody ankylosis at C2-C3 on the left. Severe disc and endplate degeneration at C5-C6. but mild if any associated cervical spinal stenosis. Upper chest: Mild T5 superior endplate compression appears to be chronic on series 8, image 34. Other visible upper thoracic levels appear grossly intact. Calcified aortic atherosclerosis. Negative lung apices. IMPRESSION: 1. No acute traumatic injury identified in the cervical spine. 2. A mild T5 compression fracture appears to be chronic. 3. Cervical spine degeneration including C3 through C5 acquired ankylosis with superimposed spondylolisthesis. 4. Aortic Atherosclerosis (ICD10-I70.0). Electronically Signed   By: Genevie Ann M.D.   On: 02/14/2021 10:51   DG Humerus Right  Result Date: 02/14/2021 CLINICAL DATA:  87 year old female with  confusion. Falls. EXAM: RIGHT HUMERUS - 2+ VIEW COMPARISON:  Right humerus series 08/25/2017. 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 Right glenohumeral joint alignment maintained. Grossly maintained right elbow alignment. No definite acute osseous abnormality of the humerus. IMPRESSION: 1. Chronic nonunion and pseudoarthrosis suspected following the 2018 right humeral shaft fracture. 2.  No acute osseous abnormality identified. Electronically Signed   By: Genevie Ann M.D.   On: 02/14/2021 10:39   DG Hip Unilat W or Wo Pelvis 2-3 Views Right  Result Date: 02/14/2021 CLINICAL DATA:  87 year old female with confusion. Falls. EXAM: DG HIP (WITH OR WITHOUT PELVIS) 2-3V RIGHT COMPARISON:  None. FINDINGS: Femoral heads are normally located. Hip joint spaces appear symmetric. Grossly intact proximal left humerus. Proximal right humerus appears intact. No acute pelvic fracture identified. Nonobstructed visible bowel gas pattern with pelvic vascular calcifications. Calcified right femoral  artery atherosclerosis. IMPRESSION: No acute fracture or dislocation identified about the right hip or pelvis. If occult hip fracture is suspected or if the patient is unable to weightbear, MRI is the preferred modality for further evaluation. Electronically Signed   By: Genevie Ann M.D.   On: 02/14/2021 10:41    Procedures Procedures   Medications Ordered in ED Medications - No data to display  ED Course  I have reviewed the triage vital signs and the nursing notes.  Pertinent labs & imaging results that were available during my care of the patient were reviewed by me and considered in my medical decision making (see chart for details).    MDM Rules/Calculators/A&P                          Patient presenting for evaluation after an unwitnessed fall.  On exam, patient does not appear in acute distress.  She does have signs of injury that she has a contusion of her left  thigh, left shoulder, right arm, and pain with palpation of the right hip.  Leg is shortened and externally rotated, concern for hip fracture.  As there is an unknown cause for fall, will obtain labs, urine, EKG.  CT head and C-spine negative for acute findings.  Chest, humerus, and pelvis x-rays viewed and independently interpreted by me, no new fracture or dislocation. EKG nonischemic.  Pt does have small lac of the L eyelid and temple, but no gaping or active bleeding, does not need sutures.  Attempted blood work, but difficult due to pt compliance. Per MOST form, pt is mostly comfort care. I have tried to call daughter x2 without answer. In the setting of likely mechanical fall, I do not feel it is respecting pt's and family's wishes to continue to attempt to get blood work.   I was able to get in touch with daughter, who requests a urine to ensure no UTI, but agrees that blood work should not be pursued in the ED.  Pt able to ambulate in the ED. Will plan for d/c.  Daughter requesting to take patient home instead of [facility], as patient is able to ambulate I am agreeable to this.  At this time, patient appears safe for discharge.  Return precautions given.  Daughter states she understands agrees to plan.  Final Clinical Impression(s) / ED Diagnoses Final diagnoses:  Contusion of left eye, initial encounter  Fall, initial encounter    Rx / DC Orders ED Discharge Orders     None        Franchot Heidelberg, PA-C 02/14/21 1509    Sherwood Gambler, MD 02/14/21 1521

## 2021-02-14 NOTE — ED Triage Notes (Addendum)
Pt arrives via EMS from [place]. Pt had an unwitnessed fall- found on the floor alert. Pt usually walks with walker, and has hx of dementia. Pt has bruising, swelling, lac to left forehead/eye/cheek. Pt also has new bruising to right upper arm. Pt broke her right arm with previous fall and did not have surgery- arm is obviously deformed, but this is her baseline from previous fall. Pt is not on blood thinners. C collar in place. BP 160/91, CBG 166

## 2021-02-14 NOTE — ED Notes (Signed)
Per PA, will hold off on labs at this point while awaiting imaging studies. Pt removed IV and not tolerating RN attempting to obtain labs.

## 2021-02-14 NOTE — ED Notes (Signed)
Pt daughter stated it would be best for her to take mother home. PTAR cancelled

## 2021-02-20 DIAGNOSIS — K5901 Slow transit constipation: Secondary | ICD-10-CM | POA: Diagnosis not present

## 2021-02-20 DIAGNOSIS — F0281 Dementia in other diseases classified elsewhere with behavioral disturbance: Secondary | ICD-10-CM | POA: Diagnosis not present

## 2021-02-20 DIAGNOSIS — I10 Essential (primary) hypertension: Secondary | ICD-10-CM | POA: Diagnosis not present

## 2021-02-20 DIAGNOSIS — G309 Alzheimer's disease, unspecified: Secondary | ICD-10-CM | POA: Diagnosis not present

## 2021-02-23 DIAGNOSIS — Z1159 Encounter for screening for other viral diseases: Secondary | ICD-10-CM | POA: Diagnosis not present

## 2021-02-23 DIAGNOSIS — Z20828 Contact with and (suspected) exposure to other viral communicable diseases: Secondary | ICD-10-CM | POA: Diagnosis not present

## 2021-03-02 ENCOUNTER — Other Ambulatory Visit: Payer: Self-pay

## 2021-03-02 ENCOUNTER — Non-Acute Institutional Stay: Payer: Medicare Other | Admitting: Hospice

## 2021-03-02 DIAGNOSIS — Z515 Encounter for palliative care: Secondary | ICD-10-CM

## 2021-03-02 DIAGNOSIS — F03911 Unspecified dementia, unspecified severity, with agitation: Secondary | ICD-10-CM

## 2021-03-02 DIAGNOSIS — F0391 Unspecified dementia with behavioral disturbance: Secondary | ICD-10-CM

## 2021-03-02 NOTE — Progress Notes (Signed)
[place] Consult Note Telephone: [phone]  Fax: [phone]  PATIENT NAME: Megan Gallegos DOB: 1920/03/21 MRN: [id number]  PRIMARY CARE PROVIDER:   [facility] Marvis Moeller NP             REFERRING PROVIDER:  [facility] Marvis Moeller NP   RESPONSIBLE PARTY:   Miguel Rota - daughter Oracle     Name Relation Home Work Mobile   [place] Daughter [phone]         Visit is to build trust and highlight Palliative Medicine as specialized medical care for people living with serious illness, aimed at facilitating better quality of life through symptoms relief, assisting with advance care planning and complex medical decision making.  NP called and spoke with Diane updating her on visit.  This is a follow up visit.  RECOMMENDATIONS/PLAN:   Advance Care Planning/Code Status: Patient is a Do Not Resuscitate  Goals of Care: Goals of care include to maximize quality of life and symptom management.  Visit consisted of counseling and education dealing with the complex and emotionally intense issues of symptom management and palliative care in the setting of serious and potentially life-threatening illness. Palliative care team will continue to support patient, patient's family, and medical team.  Symptom management/Plan:  Gait disturbance: Recent fall with ED visit, not hospitalized. Fall precautions. Patient and family not interested in PT/OT.  Continue in-house daily exercises to optimize wellbeing. Dementia: Ongoing memory loss and confusion in line with Dementia disease trajectory.  Fast 6D.  Continue donepezil as ordered. Hypertension. Continue Amlodipine and Losartan.   Follow up: Palliative care will continue to follow for complex medical decision making, advance care planning, and clarification of goals. Return 6 weeks or prn. Encouraged to call provider sooner with any concerns.  CHIEF COMPLAINT:  Palliative follow up  HISTORY OF PRESENT ILLNESS:  Megan Gallegos a 88 y.o. female with multiple medical problems including gait disturbance resulting in a fall last month with contusion to left eye; resolved at this time.  History of dementia, agitation, hypertension, protein caloric malnutrition.  History obtained from review of EMR, discussion with primary team, family and/or patient. Records reviewed and summarized above. All 10 point systems reviewed and are negative except as documented in history of present illness above  Review and summarization of Epic records shows history from other than patient.   Palliative Care was asked to follow this patient o help address complex decision making in the context of advance care planning and goals of care clarification.   PHYSICAL EXAM  General: In no acute distress, appropriately dressed Cardiovascular: regular rate and rhythm; no edema in BLE Pulmonary: no cough, no increased work of breathing, normal respiratory effort Abdomen: soft, non tender, no guarding, positive bowel sounds in all quadrants GU:  no suprapubic tenderness Eyes: Normal lids, no discharge ENMT: Moist mucous membranes Musculoskeletal:  weakness, sarcopenia Skin: no rash to visible skin, warm without cyanosis,  Psych: non-anxious affect Neurological: Weakness but otherwise non focal, memory loss Heme/lymph/immuno: no bruises, no bleeding  PERTINENT MEDICATIONS:  Outpatient Encounter Medications as of 03/02/2021  Medication Sig   amLODipine (NORVASC) 10 MG tablet Take 10 mg by mouth daily.   amLODipine (NORVASC) 5 MG tablet Take 5 mg by mouth daily.   aspirin EC 81 MG tablet Take 81 mg by mouth every evening.   b complex vitamins capsule Take 1 capsule by mouth every evening.   calcitRIOL (  ROCALTROL) 0.25 MCG capsule    Calcium Carbonate (CALTRATE 600 PO) Take 1 tablet by mouth daily.   co-enzyme Q-10 30 MG capsule Take 30 mg by mouth daily with lunch.   doxercalciferol  (HECTOROL) 0.5 MCG capsule Take 0.5 mcg by mouth daily.   fish oil-omega-3 fatty acids 1000 MG capsule Take 1 g by mouth 2 (two) times daily.   furosemide (LASIX) 40 MG tablet Take 40 mg by mouth daily.   losartan-hydrochlorothiazide (HYZAAR) 100-12.5 MG tablet Take by mouth.   Multiple Vitamins-Minerals (OCUVITE PRESERVISION PO) Take 1 tablet by mouth daily.   telmisartan (MICARDIS) 80 MG tablet Take 80 mg by mouth daily.   triamcinolone cream (KENALOG) 0.1 % Apply 1 application topically 2 (two) times daily.   No facility-administered encounter medications on file as of 03/02/2021.    HOSPICE ELIGIBILITY/DIAGNOSIS: TBD  PAST MEDICAL HISTORY:  Past Medical History:  Diagnosis Date   Cancer ([place])    breast   Hypertension    Kidney disease    Thyroid disease       ALLERGIES:  Allergies  Allergen Reactions   Ceclor [Cefaclor]       I spent 50 minutes providing this consultation; this includes time spent with patient/family, chart review and documentation. More than 50% of the time in this consultation was spent on counseling and coordinating communication   Thank you for the opportunity to participate in the care of Megan Gallegos Please call our office at [phone] if we can be of additional assistance.  Note: Portions of this note were generated with [employer]. Dictation errors may occur despite best attempts at proofreading.  Teodoro Spray, NP

## 2021-03-06 DIAGNOSIS — Z1159 Encounter for screening for other viral diseases: Secondary | ICD-10-CM | POA: Diagnosis not present

## 2021-03-06 DIAGNOSIS — Z20828 Contact with and (suspected) exposure to other viral communicable diseases: Secondary | ICD-10-CM | POA: Diagnosis not present

## 2021-03-16 DIAGNOSIS — Z20828 Contact with and (suspected) exposure to other viral communicable diseases: Secondary | ICD-10-CM | POA: Diagnosis not present

## 2021-03-16 DIAGNOSIS — Z1159 Encounter for screening for other viral diseases: Secondary | ICD-10-CM | POA: Diagnosis not present

## 2021-03-20 DIAGNOSIS — G309 Alzheimer's disease, unspecified: Secondary | ICD-10-CM | POA: Diagnosis not present

## 2021-03-20 DIAGNOSIS — I10 Essential (primary) hypertension: Secondary | ICD-10-CM | POA: Diagnosis not present

## 2021-03-20 DIAGNOSIS — F419 Anxiety disorder, unspecified: Secondary | ICD-10-CM | POA: Diagnosis not present

## 2021-03-20 DIAGNOSIS — Z20828 Contact with and (suspected) exposure to other viral communicable diseases: Secondary | ICD-10-CM | POA: Diagnosis not present

## 2021-03-20 DIAGNOSIS — F0281 Dementia in other diseases classified elsewhere with behavioral disturbance: Secondary | ICD-10-CM | POA: Diagnosis not present

## 2021-03-20 DIAGNOSIS — Z1159 Encounter for screening for other viral diseases: Secondary | ICD-10-CM | POA: Diagnosis not present

## 2021-03-23 DIAGNOSIS — Z20828 Contact with and (suspected) exposure to other viral communicable diseases: Secondary | ICD-10-CM | POA: Diagnosis not present

## 2021-03-23 DIAGNOSIS — Z1159 Encounter for screening for other viral diseases: Secondary | ICD-10-CM | POA: Diagnosis not present

## 2021-03-30 DIAGNOSIS — Z1159 Encounter for screening for other viral diseases: Secondary | ICD-10-CM | POA: Diagnosis not present

## 2021-03-30 DIAGNOSIS — Z20828 Contact with and (suspected) exposure to other viral communicable diseases: Secondary | ICD-10-CM | POA: Diagnosis not present

## 2021-04-06 DIAGNOSIS — Z20828 Contact with and (suspected) exposure to other viral communicable diseases: Secondary | ICD-10-CM | POA: Diagnosis not present

## 2021-04-06 DIAGNOSIS — Z1159 Encounter for screening for other viral diseases: Secondary | ICD-10-CM | POA: Diagnosis not present

## 2021-04-17 DIAGNOSIS — K5901 Slow transit constipation: Secondary | ICD-10-CM | POA: Diagnosis not present

## 2021-04-17 DIAGNOSIS — I10 Essential (primary) hypertension: Secondary | ICD-10-CM | POA: Diagnosis not present

## 2021-04-17 DIAGNOSIS — G309 Alzheimer's disease, unspecified: Secondary | ICD-10-CM | POA: Diagnosis not present

## 2021-04-17 DIAGNOSIS — F0281 Dementia in other diseases classified elsewhere with behavioral disturbance: Secondary | ICD-10-CM | POA: Diagnosis not present

## 2021-05-11 DIAGNOSIS — R5381 Other malaise: Secondary | ICD-10-CM | POA: Diagnosis not present

## 2021-05-11 DIAGNOSIS — H903 Sensorineural hearing loss, bilateral: Secondary | ICD-10-CM | POA: Diagnosis not present

## 2021-05-11 DIAGNOSIS — N184 Chronic kidney disease, stage 4 (severe): Secondary | ICD-10-CM | POA: Diagnosis not present

## 2021-05-11 DIAGNOSIS — Z515 Encounter for palliative care: Secondary | ICD-10-CM | POA: Diagnosis not present

## 2021-07-10 DIAGNOSIS — G301 Alzheimer's disease with late onset: Secondary | ICD-10-CM | POA: Diagnosis not present

## 2021-07-10 DIAGNOSIS — R5381 Other malaise: Secondary | ICD-10-CM | POA: Diagnosis not present

## 2021-07-10 DIAGNOSIS — Z515 Encounter for palliative care: Secondary | ICD-10-CM | POA: Diagnosis not present

## 2021-07-10 DIAGNOSIS — S80812A Abrasion, left lower leg, initial encounter: Secondary | ICD-10-CM | POA: Diagnosis not present

## 2021-07-17 DIAGNOSIS — F02B3 Dementia in other diseases classified elsewhere, moderate, with mood disturbance: Secondary | ICD-10-CM | POA: Diagnosis not present

## 2021-07-17 DIAGNOSIS — G301 Alzheimer's disease with late onset: Secondary | ICD-10-CM | POA: Diagnosis not present

## 2021-07-17 DIAGNOSIS — W19XXXA Unspecified fall, initial encounter: Secondary | ICD-10-CM | POA: Diagnosis not present

## 2021-07-17 DIAGNOSIS — S51802A Unspecified open wound of left forearm, initial encounter: Secondary | ICD-10-CM | POA: Diagnosis not present

## 2021-10-04 DEATH — deceased

## 2022-11-21 IMAGING — DX DG HUMERUS 2V *R*
2 series · 2 of 2 positions shown · non-contrast
Comparison: Right humerus series 08/25/2017.

CLINICAL DATA: [AGE] female with confusion. Falls.

EXAM:
RIGHT HUMERUS - 2+ VIEW

[t humerus ap right]
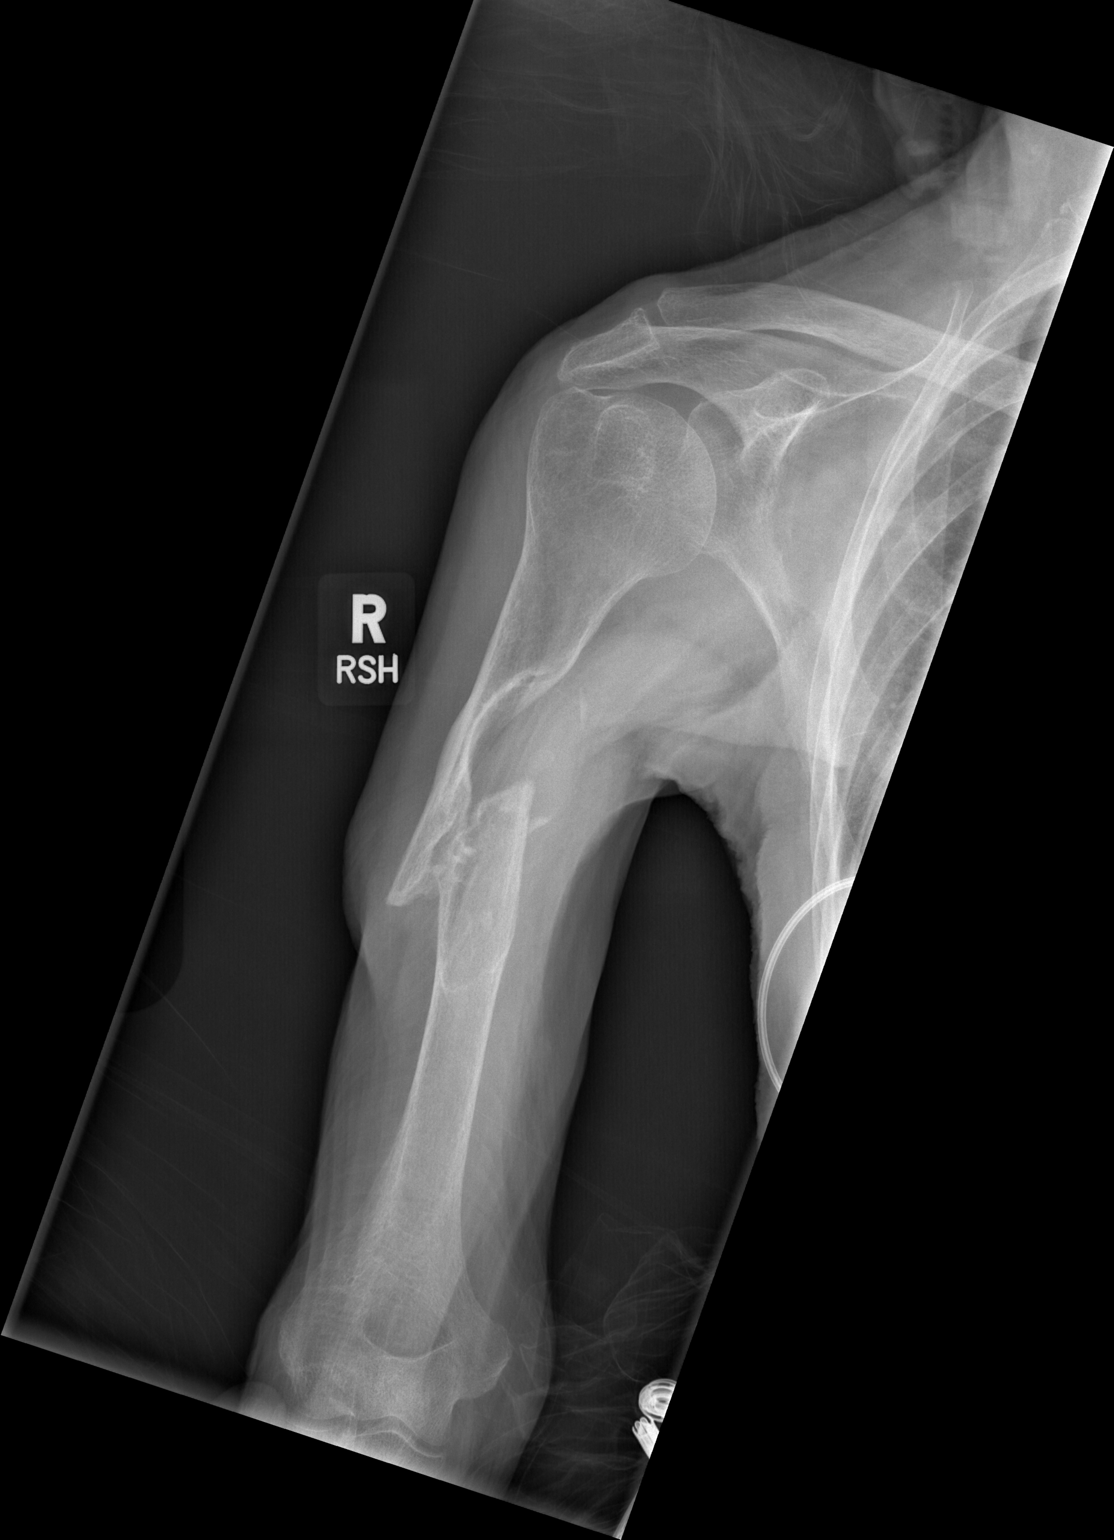

[t humerus lat right]
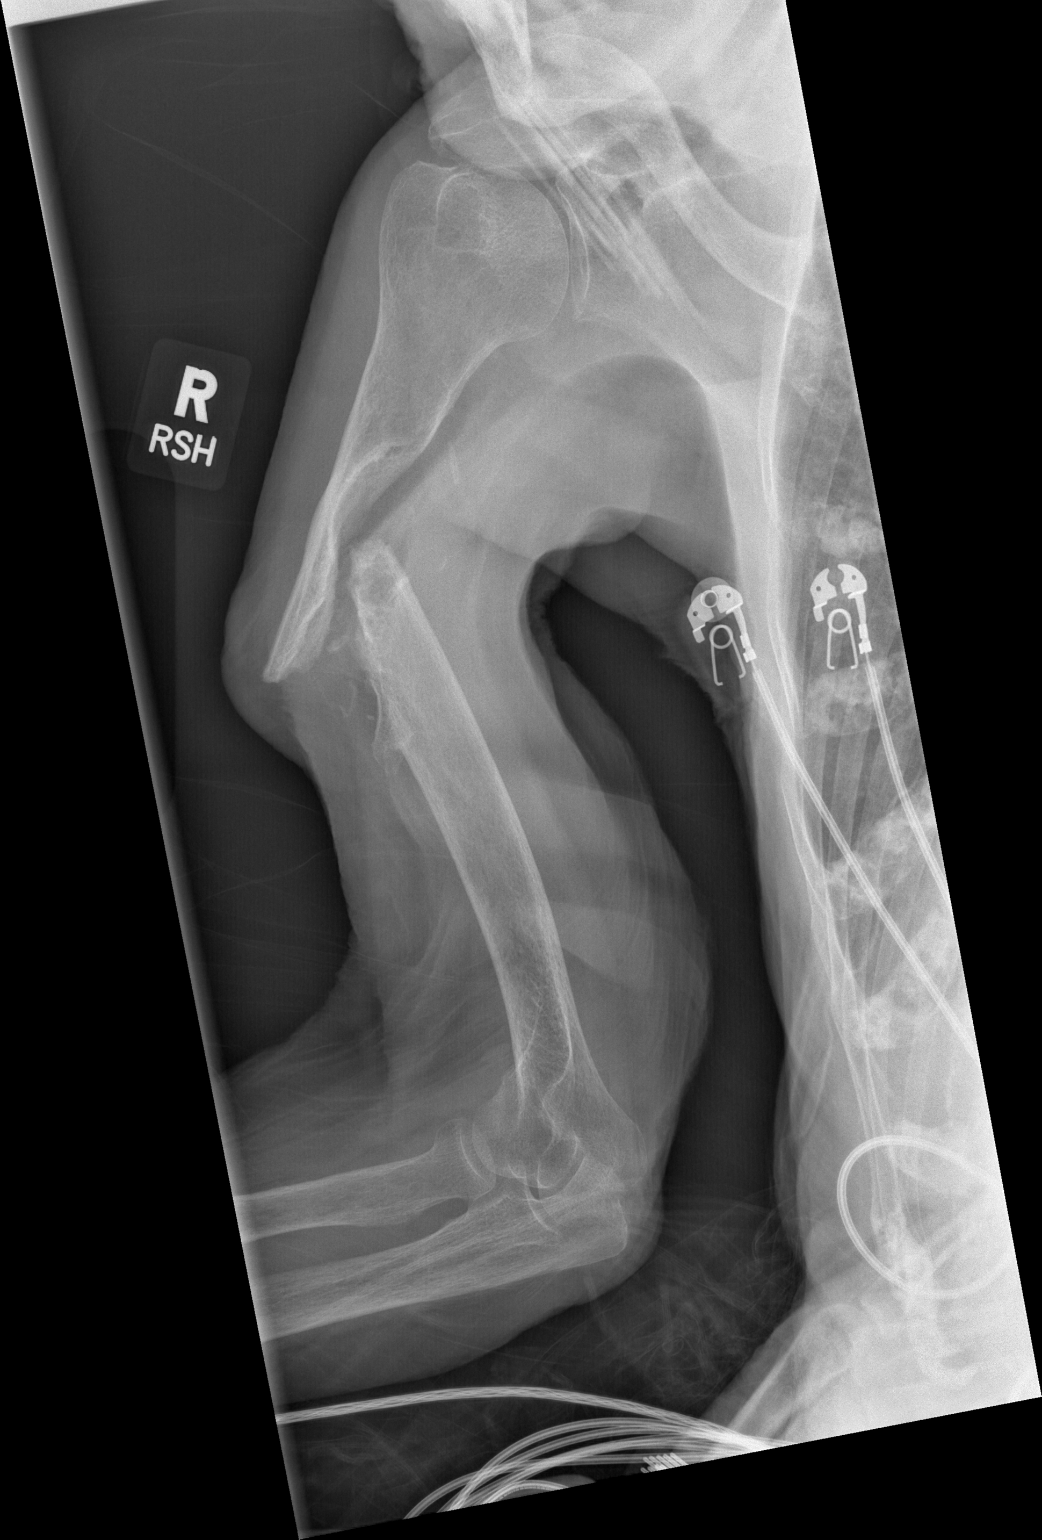

[2 of 2 positions shown; findings below may reference images not displayed]

FINDINGS: Chronic nonunion of the right humeral shaft fracture with
pseudoarthrosis and angulation. Right glenohumeral joint alignment
maintained. Grossly maintained right elbow alignment. No definite
acute osseous abnormality of the humerus.
IMPRESSION: 1. Chronic nonunion and pseudoarthrosis suspected following the 8722
right humeral shaft fracture.
2.  No acute osseous abnormality identified.

## 2022-11-21 IMAGING — DX DG CHEST 1V
1 series · 1 of 1 positions shown · non-contrast
Comparison: Portable chest 08/25/2017 and earlier.

CLINICAL DATA: [AGE] female with confusion.  Falls.

EXAM:
CHEST  1 VIEW

[t chest supine]
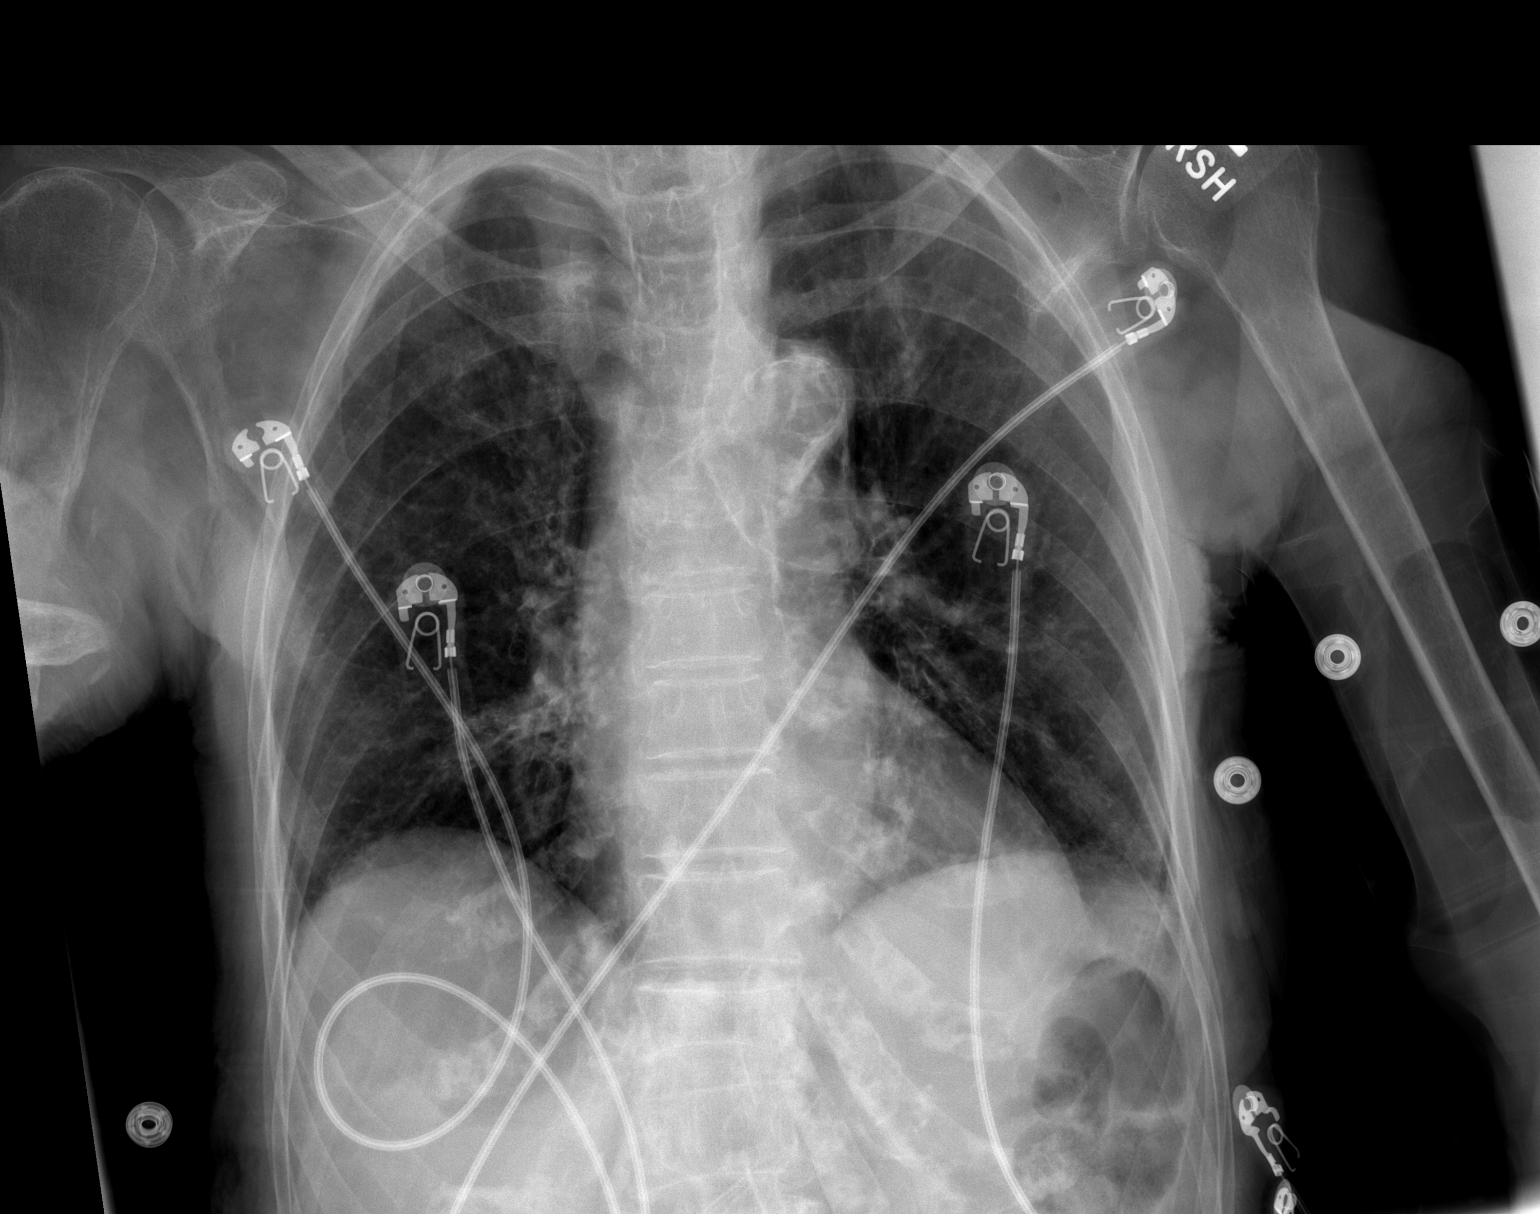

[1 of 1 positions shown; findings below may reference images not displayed]

FINDINGS: Supine AP view at 0181 hours. Possible chronic right humeral shaft
fracture, uncertain. Lung volumes and mediastinal contours are
normal. Calcified aortic atherosclerosis. Visualized tracheal air
column is within normal limits. Allowing for portable technique the
lungs are clear. Negative visible bowel gas pattern.
IMPRESSION: 1.  No acute cardiopulmonary abnormality.
2.  Aortic Atherosclerosis ([id number]).
